# Patient Record
Sex: Female | Born: 1985 | Race: White | Hispanic: No | Marital: Married | Smoking: Never smoker
Health system: Southern US, Community
[De-identification: ages and names within clinical notes are randomized; demographics above are authoritative.]

## PROBLEM LIST (undated history)

## (undated) ENCOUNTER — Inpatient Hospital Stay (HOSPITAL_COMMUNITY): Payer: Self-pay

## (undated) DIAGNOSIS — R51 Headache: Secondary | ICD-10-CM

## (undated) DIAGNOSIS — R87629 Unspecified abnormal cytological findings in specimens from vagina: Secondary | ICD-10-CM

## (undated) DIAGNOSIS — F1910 Other psychoactive substance abuse, uncomplicated: Secondary | ICD-10-CM

## (undated) DIAGNOSIS — F32A Depression, unspecified: Secondary | ICD-10-CM

## (undated) DIAGNOSIS — F410 Panic disorder [episodic paroxysmal anxiety] without agoraphobia: Secondary | ICD-10-CM

## (undated) DIAGNOSIS — E039 Hypothyroidism, unspecified: Secondary | ICD-10-CM

## (undated) DIAGNOSIS — K219 Gastro-esophageal reflux disease without esophagitis: Secondary | ICD-10-CM

## (undated) DIAGNOSIS — R519 Headache, unspecified: Secondary | ICD-10-CM

## (undated) DIAGNOSIS — IMO0002 Reserved for concepts with insufficient information to code with codable children: Secondary | ICD-10-CM

## (undated) DIAGNOSIS — R87619 Unspecified abnormal cytological findings in specimens from cervix uteri: Secondary | ICD-10-CM

## (undated) DIAGNOSIS — F329 Major depressive disorder, single episode, unspecified: Secondary | ICD-10-CM

## (undated) DIAGNOSIS — E079 Disorder of thyroid, unspecified: Secondary | ICD-10-CM

## (undated) DIAGNOSIS — F909 Attention-deficit hyperactivity disorder, unspecified type: Secondary | ICD-10-CM

## (undated) HISTORY — PX: RHINOPLASTY: SUR1284

## (undated) HISTORY — DX: Major depressive disorder, single episode, unspecified: F32.9

## (undated) HISTORY — DX: Reserved for concepts with insufficient information to code with codable children: IMO0002

## (undated) HISTORY — PX: FRACTURE SURGERY: SHX138

## (undated) HISTORY — PX: COSMETIC SURGERY: SHX468

## (undated) HISTORY — PX: OTHER SURGICAL HISTORY: SHX169

## (undated) HISTORY — DX: Disorder of thyroid, unspecified: E07.9

## (undated) HISTORY — PX: BREAST SURGERY: SHX581

## (undated) HISTORY — DX: Unspecified abnormal cytological findings in specimens from cervix uteri: R87.619

## (undated) HISTORY — DX: Gastro-esophageal reflux disease without esophagitis: K21.9

## (undated) HISTORY — DX: Depression, unspecified: F32.A

## (undated) HISTORY — DX: Other psychoactive substance abuse, uncomplicated: F19.10

## (undated) HISTORY — DX: Attention-deficit hyperactivity disorder, unspecified type: F90.9

## (undated) HISTORY — PX: NASAL SEPTUM SURGERY: SHX37

---

## 2006-07-24 ENCOUNTER — Other Ambulatory Visit: Admission: RE | Admit: 2006-07-24 | Discharge: 2006-07-24 | Payer: Self-pay | Admitting: Obstetrics and Gynecology

## 2006-09-04 ENCOUNTER — Emergency Department (HOSPITAL_COMMUNITY): Admission: EM | Admit: 2006-09-04 | Discharge: 2006-09-05 | Payer: Self-pay | Admitting: Emergency Medicine

## 2008-03-11 ENCOUNTER — Inpatient Hospital Stay (HOSPITAL_COMMUNITY): Admission: EM | Admit: 2008-03-11 | Discharge: 2008-03-13 | Payer: Self-pay | Admitting: Emergency Medicine

## 2008-03-15 ENCOUNTER — Inpatient Hospital Stay (HOSPITAL_COMMUNITY): Admission: AD | Admit: 2008-03-15 | Discharge: 2008-03-16 | Payer: Self-pay | Admitting: Orthopedic Surgery

## 2008-03-22 ENCOUNTER — Ambulatory Visit (HOSPITAL_BASED_OUTPATIENT_CLINIC_OR_DEPARTMENT_OTHER): Admission: RE | Admit: 2008-03-22 | Discharge: 2008-03-22 | Payer: Self-pay | Admitting: Otolaryngology

## 2008-06-23 ENCOUNTER — Encounter (INDEPENDENT_AMBULATORY_CARE_PROVIDER_SITE_OTHER): Payer: Self-pay | Admitting: Family Medicine

## 2008-06-26 ENCOUNTER — Ambulatory Visit: Payer: Self-pay | Admitting: Nurse Practitioner

## 2008-06-26 ENCOUNTER — Encounter (INDEPENDENT_AMBULATORY_CARE_PROVIDER_SITE_OTHER): Payer: Self-pay | Admitting: Family Medicine

## 2008-06-26 DIAGNOSIS — K029 Dental caries, unspecified: Secondary | ICD-10-CM | POA: Insufficient documentation

## 2008-06-26 DIAGNOSIS — J309 Allergic rhinitis, unspecified: Secondary | ICD-10-CM | POA: Insufficient documentation

## 2010-06-21 ENCOUNTER — Ambulatory Visit: Payer: Self-pay | Admitting: Diagnostic Radiology

## 2010-06-21 ENCOUNTER — Emergency Department (HOSPITAL_BASED_OUTPATIENT_CLINIC_OR_DEPARTMENT_OTHER)
Admission: EM | Admit: 2010-06-21 | Discharge: 2010-06-21 | Payer: Self-pay | Source: Home / Self Care | Admitting: Emergency Medicine
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
## 2011-03-25 NOTE — Consult Note (Signed)
NAMESHARISSA, [name]                  ACCOUNT NO.:  [id number]   MEDICAL RECORD NO.:  [id number]          PATIENT TYPE:  OIB   LOCATION:  5040                         FACILITY:  [facility]   PHYSICIAN:  Kirsten Done, MD  DATE OF BIRTH:  [birth year]   DATE OF CONSULTATION:  DATE OF DISCHARGE:                                 CONSULTATION   Kirsten Fox was seen and evaluated on Mar 13, 2008.  The patient was consulted  by one of my partners Dr. Malon Kindle, for the evaluation and  treatment of her right comminuted ulnar shaft fracture.  I had a  discussion with the family on Mar 13, 2008 as well as the patient is felt  that the patient given her fracture and the comminution was recommended  that she undergo open reduction and internal fixation of the ulna shaft.  I talked with the patient and the patient's mother on Mar 13, 2008.  The  patient was discharged by the trauma service and brought back to the  hospital on Mar 14, 2008, to undergo the procedure.  At the time of the  initial presentation as well as the night of surgery, I talked about the  reasons for surgery given the displacement of the ulna shaft fracture  and the risks of surgery.  We talked about the risks include but not  limited to bleeding, infection, nerve tendon artery damage, nonunion,  malunion, hardware failure, loss of motion of the wrist and forearm need  for further surgical intervention as well as possible removal of the  implant.  All questions were addressed to the patient and the patient's  mother.  The plan was to do this as an outpatient; however, her pain was  not controlled.  She be admitted overnight for observation.  Signed  informed consent was obtained on the day of surgery.  The patient also  sustained other injuries but I was consulted and I was treating the ulna  shaft fracture.      Kirsten Done, MD  Electronically Signed     FWO/MEDQ  D:  03/14/2008  T:  03/15/2008  Job:  [id number]
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
## 2011-03-25 NOTE — Op Note (Signed)
Kirsten Fox, Kirsten Fox                  ACCOUNT NO.:  [id number]   MEDICAL RECORD NO.:  [id number]          PATIENT TYPE:  AMB   LOCATION:  DSC                          FACILITY:  [facility]   PHYSICIAN:  Suzanna Obey, M.D.       DATE OF BIRTH:  Apr 30, 1986   DATE OF PROCEDURE:  03/22/2008  DATE OF DISCHARGE:                               OPERATIVE REPORT   PREOPERATIVE DIAGNOSIS:  Nasal fracture.   POSTOPERATIVE DIAGNOSIS:  Nasal fracture.   PROCEDURE:  Closed reduction nasal fracture.   ANESTHESIA:  General.   ESTIMATED BLOOD LOSS:  Less than 5 milliliters.   INDICATIONS:  A 25 year old who has had problems with nasal fracture  after correction.  Her septa or her nose is deviated to right.  She is  informed the risk and benefits of the procedure, and options were  discussed.  All questions were answered, and consent was obtained.   OPERATION:  The patient was taken to the operating room and placed in  supine position.  After adequate LMA anesthesia, she was draped, and  then the oxymetazoline pledgets were placed into the nose bilaterally.  There was a small amount of crusting in the right side, but both sides  look nicely open and the septum was relatively midline.  There had been  an irritation up in the right upper nasal cavity that appeared to be  from a septal injury, but that has significantly improved.  The dorsum  was then positioned back into the midline using the butter knife and the  external pressure.  The nasal dorsum then looked to be in midline, and  Steri-Strips and benzoin were placed over the dorsum and an Aquaplast  cast placed.  The patient was awakened and brought to recovery room in  stable condition, counts correct.           ______________________________  Suzanna Obey, M.D.     JB/MEDQ  D:  03/22/2008  T:  03/23/2008  Job:  [id number]
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
## 2011-07-16 ENCOUNTER — Emergency Department (HOSPITAL_BASED_OUTPATIENT_CLINIC_OR_DEPARTMENT_OTHER)
Admission: EM | Admit: 2011-07-16 | Discharge: 2011-07-16 | Disposition: A | Discharge status: Home / Self Care | Payer: Self-pay | Attending: Emergency Medicine | Admitting: Emergency Medicine

## 2011-07-16 ENCOUNTER — Encounter: Payer: Self-pay | Admitting: *Deleted

## 2011-07-16 DIAGNOSIS — F1010 Alcohol abuse, uncomplicated: Secondary | ICD-10-CM | POA: Insufficient documentation

## 2011-07-16 DIAGNOSIS — F410 Panic disorder [episodic paroxysmal anxiety] without agoraphobia: Secondary | ICD-10-CM | POA: Insufficient documentation

## 2011-07-16 DIAGNOSIS — F10929 Alcohol use, unspecified with intoxication, unspecified: Secondary | ICD-10-CM

## 2011-07-16 HISTORY — DX: Panic disorder (episodic paroxysmal anxiety): F41.0

## 2011-07-16 LAB — URINALYSIS, ROUTINE W REFLEX MICROSCOPIC
Bilirubin Urine: NEGATIVE
Glucose, UA: NEGATIVE mg/dL
Hgb urine dipstick: NEGATIVE
Nitrite: NEGATIVE
Protein, ur: NEGATIVE mg/dL
Specific Gravity, Urine: 1.021 (ref 1.005–1.030)
Urobilinogen, UA: 0.2 mg/dL (ref 0.0–1.0)
pH: 5.5 (ref 5.0–8.0)

## 2011-07-16 LAB — CBC
Hemoglobin: 16.4 g/dL — ABNORMAL HIGH (ref 12.0–15.0)
MCH: 33 pg (ref 26.0–34.0)
Platelets: 280 10*3/uL (ref 150–400)
RBC: 4.97 MIL/uL (ref 3.87–5.11)
RDW: 12.7 % (ref 11.5–15.5)
WBC: 7 10*3/uL (ref 4.0–10.5)

## 2011-07-16 LAB — BASIC METABOLIC PANEL
BUN: 11 mg/dL (ref 6–23)
Chloride: 104 mEq/L (ref 96–112)
Glucose, Bld: 90 mg/dL (ref 70–99)
Potassium: 4 mEq/L (ref 3.5–5.1)

## 2011-07-16 LAB — DIFFERENTIAL
Basophils Absolute: 0 10*3/uL (ref 0.0–0.1)
Eosinophils Relative: 1 % (ref 0–5)
Lymphocytes Relative: 39 % (ref 12–46)
Monocytes Relative: 7 % (ref 3–12)
Neutro Abs: 3.7 10*3/uL (ref 1.7–7.7)
Neutrophils Relative %: 52 % (ref 43–77)

## 2011-07-16 LAB — PREGNANCY, URINE: Preg Test, Ur: NEGATIVE

## 2011-07-16 MED ORDER — SODIUM CHLORIDE 0.9 % IV BOLUS (SEPSIS)
1000.0000 mL | Freq: Once | INTRAVENOUS | Status: AC
  Administered 2011-07-16: 1000 mL via INTRAVENOUS

## 2011-07-16 NOTE — ED Notes (Signed)
Pt initally OOB to BR.  Pt unsteady gait and slurred speech noted.  Pt reports alcohol intake earlier.  Pt given specimen cup and instructions.  Pt urinated with no difficulty into cup then proceeded to discard specimen into sink and fill cup with water.  When asked what she was doing pt replied "giving a sample" and "this wont go on my record right"  Specimen/water was discarded into toilet and pt was informed that she will need to give another sample that will be supervised.  Pt escorted back to bed/room

## 2011-07-16 NOTE — ED Notes (Signed)
Pt. Has had etoh this night with noted xanax 1mg  this per Pt.
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
## 2011-07-16 NOTE — ED Notes (Signed)
Patient ambulates back to bed from restroom with friend/family member. Urien labeled and sent to lab for testing.

## 2011-07-16 NOTE — ED Provider Notes (Signed)
History     CSN: [id number] Arrival date & time: 07/16/2011 12:55 AM  Chief Complaint  Patient presents with  . Alcohol Intoxication   HPI Pt brought to the ED via EMS with reports of panic attack and alcohol intoxication. Pt's friends now at bedside state she got into an argument and then 'had a seizure'. Pt does not know what happened. Friend states she was trembling all over for less than a minute. Pt has no prior history of seizures. She has history of anxiety and panic attacks but stopped taking her xanax a few days ago. She took two remaining xanax earlier today before drinking EtOH.   Pt is unable to give clear history due to intoxication.   Past Medical History  Diagnosis Date  . Panic attacks     Past Surgical History  Procedure Date  . Fracture surgery     on the R arm with metal rod in forearm  . Cosmetic surgery   . Nasal septum surgery     No family history on file.  History  Substance Use Topics  . Smoking status: Not on file  . Smokeless tobacco: Not on file  . Alcohol Use:     OB History    Grav Para Term Preterm Abortions TAB SAB Ect Mult Living                  Review of Systems All other systems reviewed and are negative except as noted in HPI.   Physical Exam  BP 122/88  Pulse 111  Temp(Src) 97.5 F (36.4 C) (Oral)  Resp 15  SpO2 97%  LMP 07/09/2011  Physical Exam  Nursing note and vitals reviewed. Constitutional: She appears well-developed and well-nourished.  HENT:  Head: Normocephalic and atraumatic.  Eyes: EOM are normal. Pupils are equal, round, and reactive to light.  Neck: Normal range of motion. Neck supple.  Cardiovascular: Normal rate, normal heart sounds and intact distal pulses.   Pulmonary/Chest: Effort normal and breath sounds normal.  Abdominal: Bowel sounds are normal. She exhibits no distension. There is no tenderness.  Musculoskeletal: Normal range of motion. She exhibits no edema and no tenderness.  Neurological:  She is alert. No cranial nerve deficit.  Skin: Skin is warm and dry. No rash noted.  Psychiatric: She has a normal mood and affect.    ED Course  Procedures  MDM Labs and IVF ordered. Will observe for any further seizure activity.     4:14 AM Pt sleeping soundly. Friend at the bedside is comfortable taking her home for sobering. No seizure activity seen here.   Charles B. Bernette Mayers, MD 07/16/11 [phone]

## 2011-07-16 NOTE — ED Notes (Signed)
Pt. Reports she had beer and 1 liquor drink and her xanax.

## 2011-07-16 NOTE — ED Notes (Signed)
Pt. Jerking around in the bed with no noted seizure activity.

## 2011-07-16 NOTE — ED Notes (Signed)
Friends at bedside.

## 2011-07-16 NOTE — ED Notes (Signed)
Patient up to bathroom with assistance from me and her friend/family member. Patient with unsteady gait and slurred speech. Patient and friend/family member given instructions on prviding specimen.

## 2011-07-16 NOTE — ED Notes (Signed)
Pt. Reports she is upset due to being kicked out of his house.  Pt. Crying and gagging at same time.  Pt. Keeps asking about any friends here for support.

## 2011-07-18 ENCOUNTER — Encounter (HOSPITAL_BASED_OUTPATIENT_CLINIC_OR_DEPARTMENT_OTHER): Payer: Self-pay | Admitting: *Deleted

## 2011-12-16 ENCOUNTER — Telehealth: Payer: Self-pay

## 2011-12-16 NOTE — Telephone Encounter (Signed)
.  [facility]     PT STATES SWITCHING  30 TO 40 MG VYVANSE IS NOT WORKING,SHE IS CRASHING,NOT FOCUSING,GENERALLY DOES'NT FEEL  WELL.    PLEASE CALL [phone]

## 2011-12-16 NOTE — Telephone Encounter (Signed)
Please locate chart, if pt may need to RTC, have PA review

## 2011-12-17 NOTE — Telephone Encounter (Signed)
Spoke with patient and reviewed chart.  Due for OV this month, advised pt to RTC to discuss med/change rx.  She currently cannot afford to, but plans to recheck as soon as she is able.  Patient is unsure if Vyvanse is causing mood problems, or if her training schedule is.  Plans to continue med at this time, and recheck before out.  Reviewed with Nancie Neas, PAC.

## 2012-01-12 ENCOUNTER — Ambulatory Visit (INDEPENDENT_AMBULATORY_CARE_PROVIDER_SITE_OTHER): Payer: Self-pay | Admitting: Family Medicine

## 2012-01-12 DIAGNOSIS — F418 Other specified anxiety disorders: Secondary | ICD-10-CM

## 2012-01-12 DIAGNOSIS — F988 Other specified behavioral and emotional disorders with onset usually occurring in childhood and adolescence: Secondary | ICD-10-CM

## 2012-01-12 DIAGNOSIS — R5383 Other fatigue: Secondary | ICD-10-CM

## 2012-01-12 DIAGNOSIS — F341 Dysthymic disorder: Secondary | ICD-10-CM

## 2012-01-12 DIAGNOSIS — R635 Abnormal weight gain: Secondary | ICD-10-CM

## 2012-01-12 DIAGNOSIS — R5381 Other malaise: Secondary | ICD-10-CM

## 2012-01-12 DIAGNOSIS — R002 Palpitations: Secondary | ICD-10-CM

## 2012-01-12 LAB — POCT CBC
Granulocyte percent: 48.4 % (ref 37–80)
HCT, POC: 41.3 % (ref 37.7–47.9)
Hemoglobin: 13.3 g/dL (ref 12.2–16.2)
Lymph, poc: 3.4 (ref 0.6–3.4)
MCH, POC: 31.4 pg — AB (ref 27–31.2)
MCHC: 32.2 g/dL (ref 31.8–35.4)
MCV: 97.3 fL — AB (ref 80–97)
MID (cbc): 0.4 (ref 0–0.9)
MPV: 9.4 fL (ref 0–99.8)
POC Granulocyte: 3.5 (ref 2–6.9)
POC LYMPH PERCENT: 45.9 % (ref 10–50)
POC MID %: 5.7 % (ref 0–12)
Platelet Count, POC: 252 K/uL (ref 142–424)
RBC: 2.24 M/uL — AB (ref 4.04–5.48)
RDW, POC: 13.1 %
WBC: 7.3 K/uL (ref 4.6–10.2)

## 2012-01-12 MED ORDER — CLONAZEPAM 0.5 MG PO TABS: 0.5000 mg | ORAL_TABLET | Freq: Two times a day (BID) | ORAL | Status: DC | PRN

## 2012-01-12 NOTE — Progress Notes (Signed)
Subjective:    Patient ID: Kirsten Fox, female    DOB: 1986-01-22, 26 y.o.   MRN: [id number]  HPI Kirsten Fox is a 26 y.o. female Hx of PTSD, ADD.  Last ov 10/09/11.  proir rx adderall on 01/11/11, but had persistence of trouble focusing on reading/ paying attention/studying.  Tried mother's Vyvanse - seemed to work better.  Rx Vyvanse 30mg .  10/22/11 - increased dose over phone as 30mg  was not helping beyond first day.  Prescription picked up 12/02/11 (qualified for [employer] with no cost for rx through 11/25/12.  Hx of anxiety/insomnia per chart in past.  Multiple concerns tonight - feels like "crashed" from dieting past 3 weeks. Dieting for months prior, lost 10 pounds, but regained 10 pounds in past 2-3weeks.  Generally  Doesn't feel well.  Nutritionist told may be thyroid and took otc T3 supplement for 2 weeks.Kirsten Fox shooting pains in lower stomach - past few months - getting worse.  Hx of abnormal pap smears for past 5 years, and planning on colposcopy in 2 days.  [facility] 1 [facility], periods were every 3 weeks prior, but [facility].  Stomach swollen all the time, off and on for years, told may have IBS in past.   [facility] - negative chlamydia and gonorrhea testing.   Just doesn't feel good. Took growth hormone for 2 months, stopped 2 weeks ago.  Took Nolvadex at sime time (estrogen inhibitor).  No recent steroid use, last used steroids 2011. Feels very anxious.  Ran out of Vyvanse few weeks ago. Tolerating ok before, but made her "ill"/mean.  Has been feeling depressed/anxious past few weeks,  Crying spells.  No suicidal ideation.  Etoh: 3-4 drinks on weekend night off -  1 night per week. No illicit drug use. Nonsmoker.  Work 6d/week, school 5nts/week., Driving on revoked license recently.  Court date April 19th.    Review of Systems  Constitutional:  Positive for fatigue and unexpected weight change. Negative for fever and chills.  Respiratory: Positive for shortness of breath. Negative for cough.   Cardiovascular: Positive for chest pain and palpitations.       Palpitations last night.  Genitourinary: Negative for dysuria and difficulty urinating.  Psychiatric/Behavioral: Negative for suicidal ideas. The patient is nervous/anxious.        Anxious today.  Feeling depressed/down.  Multiple antidepressants before, but only liked wellbutrin - 300mg , last taken last year.   As above.  Voice deeper for past 1 year.     Objective:   Physical Exam  Constitutional: She is oriented to person, place, and time. She appears well-developed and well-nourished.  HENT:  Head: Normocephalic and atraumatic.  Right Ear: External ear normal.  Left Ear: External ear normal.  Eyes: Conjunctivae and EOM are normal. Pupils are equal, round, and reactive to light.  Neck: Normal range of motion.  Cardiovascular: Normal rate, regular rhythm, normal heart sounds and intact distal pulses.  Exam reveals no friction rub.   No murmur heard. Pulmonary/Chest: Effort normal and breath sounds normal. No respiratory distress.  Abdominal: Soft. Bowel sounds are normal. She exhibits no distension.  Neurological: She is alert and oriented to person, place, and time. She has normal strength. No sensory deficit.  Skin: Skin is warm and dry. No rash noted. No erythema.  Psychiatric: Her speech is normal and behavior is normal. Her mood appears anxious.  She expresses no suicidal ideation. She expresses no suicidal plans.   Results for orders placed in visit on 01/12/12  POCT CBC      Component Value Range   WBC 7.3  4.6 - 10.2 (K/uL)   Lymph, poc 3.4  0.6 - 3.4    POC LYMPH PERCENT 45.9  10 - 50 (%L)   MID (cbc) 0.4  0 - 0.9    POC MID % 5.7  0 - 12 (%M)   POC Granulocyte 3.5  2 - 6.9    Granulocyte percent 48.4  37 - 80 (%G)   RBC 2.24 (*) 4.04 - 5.48 (M/uL)    Hemoglobin 13.3  12.2 - 16.2 (g/dL)   HCT, POC 16.1  09.6 - 47.9 (%)   MCV 97.3 (*) 80 - 97 (fL)   MCH, POC 31.4 (*) 27 - 31.2 (pg)   MCHC 32.2  31.8 - 35.4 (g/dL)   RDW, POC 04.5     Platelet Count, POC 252  142 - 424 (K/uL)   MPV 9.4  0 - 99.8 (fL)          Assessment & Plan:  Kirsten Fox is a 26 y.o. female 1. Malaise  Basic metabolic panel  2. Weight gain, abnormal  TSH  3. Heart palpitations  POCT CBC  4. Depression with anxiety  TSH  5. ADD (attention deficit disorder)     Complicated history with underlying ADD, anxiety/depression, and use of growth hormone/estrogen blocking agent for weight loss and fitness competition.  Prior use of steroids.    Change in voice over past year.  Weight gain recently (regained wt lost for competition).  Palpitations and chest symptoms described as typical during anxiety - discussed EKG, but patient declined.  Given above symptoms, and affect change on Vyvanse, would not restart this med now, and no new supplements or otc hormones.  Check TSH, BMP.  Consider restart wellbutrin once labs return.  Will rx.short course of klonopin 0.5mg  BID prn next 2 weeks. # 20  Refer to endocrinology to determine next step in workup if needed, given growth hormone use.  [facility], and RTC or ER if abdominal pain persists.  Return to the clinic or go to the nearest emergency room if  symptoms worsen or new symptoms occur.  At end of visit- pt stated that she is also having infections in piercings, going on awhile. Drains pus at times.  Encouraged to rtc if sx's recur or if any drainage - rtc for eval/culture and possible antibiotics.

## 2012-01-13 LAB — BASIC METABOLIC PANEL
Calcium: 9.5 mg/dL (ref 8.4–10.5)
Potassium: 4.4 mEq/L (ref 3.5–5.3)

## 2012-01-16 ENCOUNTER — Telehealth: Payer: Self-pay | Admitting: Family Medicine

## 2012-01-16 NOTE — Telephone Encounter (Signed)
Patient requesting lab results. She stated she got a call from endocrinologist with appt but its going to cost 500.00 just to make that appt and wants to know what results showed. Please advise

## 2012-01-18 NOTE — Telephone Encounter (Signed)
Lab result call/instructions. already ordered.

## 2012-01-19 ENCOUNTER — Telehealth: Payer: Self-pay

## 2012-01-19 NOTE — Telephone Encounter (Signed)
Advised pt of lab results.

## 2012-01-19 NOTE — Telephone Encounter (Signed)
.  [facility]    PT STATES SHE CALLED LAST WK TO GET NAMES OF LABS DONE FOR HER ENDROCRONOLOGIST, DID NOT RECEIVE CALL BACK     BEST PHONE [phone]

## 2012-02-25 ENCOUNTER — Ambulatory Visit (INDEPENDENT_AMBULATORY_CARE_PROVIDER_SITE_OTHER): Payer: Self-pay | Admitting: Family Medicine

## 2012-02-25 ENCOUNTER — Encounter: Payer: Self-pay | Admitting: Physician Assistant

## 2012-02-25 ENCOUNTER — Encounter: Payer: Self-pay | Admitting: Family Medicine

## 2012-02-25 VITALS — BP 118/70 | HR 75 | Temp 99.0°F | Ht 64.0 in | Wt 150.6 lb

## 2012-02-25 DIAGNOSIS — N870 Mild cervical dysplasia: Secondary | ICD-10-CM

## 2012-02-25 LAB — POCT PREGNANCY, URINE: Preg Test, Ur: NEGATIVE

## 2012-02-25 NOTE — Patient Instructions (Signed)
No intercourse for 3-4 weeks Expect greyish vaginal discharge over the next week Please call with any concerns or complications. You will need a PAP in 6 and 12 months.

## 2012-02-25 NOTE — Progress Notes (Signed)
  Cryotherapy details The indications for cryotherapy were reviewed with the patient in detail. She was counseled about that efficacy of this procedure, and possible need for excisional procedure in the future if her cervical dysplasia persists.  The risks of the procedure where explained in detail and patient was told to expect a copious amount of discharge in the next few weeks. All her questions were answered, and written informed consent was obtained.  The patient was placed in the dorsal lithotomy position and a vaginal speculum was placed. Her cervix was visualized and patient was noted to have had normal size transformation zone. The appropriate cryotherapy probe was picked and affixed to cryotherapy apparatus. Then nitrogen gas was then activated, the probe was coated with lubricating jelly and applied to the transformation zone of the cervix. This was kept in place for 3 minutes. The cryotherapy was then stopped and all instruments were removed from the patient's pelvis; a thawing period of 3 minutes was observed.  A second cycle of cryotherapy was then administered to the cervix for 3 minutes.  The patient tolerated the procedure well without any complications. Routine post procedure instructions were given to the patient.  Patient is to return to the clinic in 4 weeks for follow-up.  Follow up PAP in 6 and 12 months.

## 2012-03-01 ENCOUNTER — Telehealth: Payer: Self-pay | Admitting: *Deleted

## 2012-03-01 NOTE — Telephone Encounter (Signed)
Patient called stating having complications from procedure that was done in office on 4/18. Pt would also like to know if she can take z-pak.

## 2012-03-03 NOTE — Telephone Encounter (Signed)
Called pt and spoke with and she informed me that she has been having a lot of discharge that is clear with yellow tinged with no odor.  Per Dr. Adrian Blackwater- pt can have discharge up to 2 weeks.  I advised pt to please make sure that she comes to her appt for follow up on 03/29/12 and to make sure that she gets her financial assistance paperwork sent in as soon as possible.  Pt stated understanding and had no further questions.

## 2012-03-29 ENCOUNTER — Ambulatory Visit: Payer: Self-pay | Admitting: Family Medicine

## 2012-04-28 ENCOUNTER — Ambulatory Visit: Payer: Self-pay | Admitting: Family Medicine

## 2012-05-04 ENCOUNTER — Ambulatory Visit (INDEPENDENT_AMBULATORY_CARE_PROVIDER_SITE_OTHER): Payer: Self-pay | Admitting: Family Medicine

## 2012-05-04 ENCOUNTER — Encounter: Payer: Self-pay | Admitting: Family Medicine

## 2012-05-04 VITALS — BP 110/69 | HR 78 | Ht 64.0 in | Wt 154.2 lb

## 2012-05-04 DIAGNOSIS — N870 Mild cervical dysplasia: Secondary | ICD-10-CM

## 2012-05-04 NOTE — Patient Instructions (Signed)
Please have PAP done at 6 months and 12 months after the cryotherapy procedure was done.

## 2012-05-04 NOTE — Progress Notes (Signed)
  Subjective:    Patient ID: Kirsten Fox, female    DOB: 1986-09-01, 26 y.o.   MRN: [id number]  HPI Patient seen for follow up of cryotherapy preformed on 02/25/12.  Has no complaints today.  Had grayish discharge following procedure for a couple of weeks, but this has improved significantly.  Denies bleeding, cramping, discharge, pain.   Review of Systems     Objective:   Physical Exam  Constitutional: She is oriented to person, place, and time. She appears well-developed and well-nourished.  Pulmonary/Chest: Effort normal.  Abdominal: Soft. She exhibits no distension and no mass. There is no tenderness. There is no rebound and no guarding.  Neurological: She is alert and oriented to person, place, and time.  Skin: Skin is warm and dry.  Psychiatric: She has a normal mood and affect. Her behavior is normal. Judgment and thought content normal.      Assessment & Plan:  1.  Persistent CIN1 - s/p cryotherapy.  No significant problems.  [facility] 6 and 12 months following procedure and for birth control.

## 2012-11-12 ENCOUNTER — Ambulatory Visit: Payer: Self-pay | Admitting: Family Medicine

## 2012-11-12 VITALS — BP 119/76 | HR 75 | Temp 98.5°F | Resp 16 | Ht 65.0 in | Wt 159.0 lb

## 2012-11-12 DIAGNOSIS — F418 Other specified anxiety disorders: Secondary | ICD-10-CM

## 2012-11-12 DIAGNOSIS — F909 Attention-deficit hyperactivity disorder, unspecified type: Secondary | ICD-10-CM

## 2012-11-12 DIAGNOSIS — F341 Dysthymic disorder: Secondary | ICD-10-CM

## 2012-11-12 MED ORDER — AMPHETAMINE-DEXTROAMPHETAMINE 5 MG PO TABS: 5.0000 mg | ORAL_TABLET | Freq: Every day | ORAL | Status: DC

## 2012-11-12 MED ORDER — AMPHETAMINE-DEXTROAMPHETAMINE 15 MG PO TABS: 15.0000 mg | ORAL_TABLET | Freq: Every day | ORAL | Status: DC

## 2012-11-12 MED ORDER — CLONAZEPAM 0.5 MG PO TABS: 0.5000 mg | ORAL_TABLET | Freq: Two times a day (BID) | ORAL | Status: DC | PRN

## 2012-11-12 MED ORDER — BUPROPION HCL ER (SR) 150 MG PO TB12: 150.0000 mg | ORAL_TABLET | Freq: Two times a day (BID) | ORAL | Status: DC

## 2012-11-12 NOTE — Progress Notes (Signed)
[facility]:  Office Visit  Chief Complaint:  Chief Complaint  Patient presents with  . Anxiety    and Depression    HPI: Kirsten Fox is a 27 y.o. female who complains of  Anxiety and Depression, was previously dx with this years ago. Had been on SSRIs and also  Wellbutrin. Other SSRI drugs made her gain weight. She was off of meds because she thought she was better but now thinks she needs it since she is feeling more depressed due to being unemployed and other issues. She lost her drivers license due to her DUI 5 years ago. She is no longer using alcohol. However in order to get to her job she had to drive but was not supposed to and got ticketed and got her license revoked. Not sure if wellbutrin helped. She has also a h/o ADHD. She also has anxiety and was on Xanax and then switched to Klonopin. She is down, can't get out of bed, can't shower. 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 She lives in [place]. Last thing she was on she was Wellbutrin, Vyvanse, and Klonopin. Vyvanse made her have HAs. She denies being suicidal or homicidal. She denies mania. She is staying with her grandmother because she started having depressive sxs and [place] and needs some support. She wants to see a therapist.  Just lost her job, was a Production designer, theatre/television/film at [employer]. They sold out to a new company and she was let go and put on unemployment. 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      Past Medical History  Diagnosis Date  . Panic attacks   . Abnormal Pap smear     2 colposcopys  . Depression   . Ulcer   . ADHD (attention deficit hyperactivity disorder)    Past Surgical History  Procedure Date  . Fracture surgery     on the R arm with metal rod in forearm  . Cosmetic surgery   . Nasal septum surgery   . Breast surgery       Implants   History   Social History  . Marital Status: Single    Spouse Name: N/A    Number of Children: N/A  . Years of Education: N/A   Social History Main Topics  . Smoking status: Never Smoker   . Smokeless tobacco: Never Used  . Alcohol Use: No     Comment: once a week  . Drug Use: No  . Sexually Active: Yes    Birth Control/ Protection: IUD   Other Topics Concern  . None   Social History Narrative  . None   No family history on file. Allergies  Allergen Reactions  . Dilaudid (Hydromorphone Hcl)   . Hydrocodone-Acetaminophen   . Morphine And Related    Prior to Admission medications   Medication Sig Start Date End Date Taking? Authorizing Provider  levonorgestrel (MIRENA) 20 MCG/24HR IUD 1 each by Intrauterine route once.   Yes Historical Provider, MD     ROS: The patient denies fevers, chills, night sweats, unintentional weight loss, chest pain, palpitations, wheezing, dyspnea on exertion, nausea, vomiting, abdominal pain, dysuria, hematuria, melena, numbness, weakness, or tingling.   All other systems have been reviewed and were otherwise negative with the exception of those mentioned in  the HPI and as above.    PHYSICAL EXAM: Filed Vitals:   11/12/12 1736  BP: 119/76  Pulse: 75  Temp: 98.5 F (36.9 C)  Resp: 16   Filed Vitals:   11/12/12 1736  Height: 5\' 5"  (1.651 m)  Weight: 159 lb (72.122 kg)   Body mass index is 26.46 kg/(m^2).  General: Alert, + depressed, slightly tearful, sound hopeless HEENT:  Normocephalic, atraumatic, oropharynx patent.  Cardiovascular:  Regular rate and rhythm, no rubs murmurs or gallops.  No Carotid bruits, radial pulse intact. No pedal edema.  Respiratory: Clear to auscultation bilaterally.  No wheezes, rales, or rhonchi.  No cyanosis, no use of accessory musculature GI: No organomegaly, abdomen is soft and non-tender, positive bowel sounds.  No masses. Skin: No rashes. Neurologic: Facial musculature  symmetric. Psychiatric: Patient is appropriate throughout our interaction. Lymphatic: No cervical lymphadenopathy Musculoskeletal: Gait intact.   LABS: Results for orders placed in visit on 02/25/12  POCT PREGNANCY, URINE      Component Value Range   Preg Test, Ur NEGATIVE  NEGATIVE     EKG/XRAY:   Primary read interpreted by Dr. Conley Rolls at [facility].   ASSESSMENT/PLAN: Encounter Diagnoses  Name Primary?  . Depression with anxiety Yes  . ADHD (attention deficit hyperactivity disorder)    Narcotic profile pulled, she is clean. Old chart pulled, she is consistent with what she is telling me and what was documented in chart.  Zung Anxiety 65 on anxiety index ( marked to severe Anxiety) Zung Depression 58 which is indicative of Depression as well Rx Adderall 15 mg in Am, 5 mg in PM #30 no RF Rx Klonopin 0.5 mg BID prn anxiety  #30, 1 RF Rx Wellbutrin 150 mg BID  F/u in 1 month Referral to [facility] Go to ER prn   Josh Nicolosi PHUONG, DO 11/13/2012 1:48 PM

## 2012-11-13 ENCOUNTER — Encounter: Payer: Self-pay | Admitting: Family Medicine

## 2013-03-31 ENCOUNTER — Encounter: Payer: Self-pay | Admitting: Obstetrics & Gynecology

## 2013-03-31 ENCOUNTER — Ambulatory Visit (INDEPENDENT_AMBULATORY_CARE_PROVIDER_SITE_OTHER): Payer: Self-pay | Admitting: Obstetrics & Gynecology

## 2013-03-31 VITALS — BP 122/70 | HR 67 | Temp 97.1°F | Ht 64.0 in | Wt 152.6 lb

## 2013-03-31 DIAGNOSIS — Z309 Encounter for contraceptive management, unspecified: Secondary | ICD-10-CM

## 2013-03-31 DIAGNOSIS — Z30432 Encounter for removal of intrauterine contraceptive device: Secondary | ICD-10-CM

## 2013-03-31 NOTE — Progress Notes (Signed)
Patient ID: Kirsten Fox, female   DOB: October 20, 1986, 27 y.o.   MRN: [id number] The patient was seen by her primary care provider who attempted to remove her IUD without success.  She was referred here for evaluation.    The patient was placed in the dorsal lithotomy position, normal external genitalia was noted.  A speculum was placed in the patient's vagina, normal discharge was noted, no lesions. The cervix was visualized, no lesions, no abnormal discharge;  and the cervix was swabbed with Betadine using scopettes. The strings of the IUD could not be visualized.  A single toothed tenaculum was placed on the anterior lip of the cervix.  An attempt was made to remove it with kelly clamps with no success.  An IUD extractor was then used to successfully remove the IUD in its entirety.  Patient tolerated the procedure well.

## 2013-03-31 NOTE — Patient Instructions (Addendum)
Contraception Choices  Contraception (birth control) is the use of any methods or devices to prevent pregnancy. Below are some methods to help avoid pregnancy.  HORMONAL METHODS   · Contraceptive implant. This is a thin, plastic tube containing progesterone hormone. It does not contain estrogen hormone. Your caregiver inserts the tube in the inner part of the upper arm. The tube can remain in place for up to 3 years. After 3 years, the implant must be removed. The implant prevents the ovaries from releasing an egg (ovulation), thickens the cervical mucus which prevents sperm from entering the uterus, and thins the lining of the inside of the uterus.  · Progesterone-only injections. These injections are given every 3 months by your caregiver to prevent pregnancy. This synthetic progesterone hormone stops the ovaries from releasing eggs. It also thickens cervical mucus and changes the uterine lining. This makes it harder for sperm to survive in the uterus.  · Birth control pills. These pills contain estrogen and progesterone hormone. They work by stopping the egg from forming in the ovary (ovulation). Birth control pills are prescribed by a caregiver. Birth control pills can also be used to treat heavy periods.  · Minipill. This type of birth control pill contains only the progesterone hormone. They are taken every day of each month and must be prescribed by your caregiver.  · Birth control patch. The patch contains hormones similar to those in birth control pills. It must be changed once a week and is prescribed by a caregiver.  · Vaginal ring. The ring contains hormones similar to those in birth control pills. It is left in the vagina for 3 weeks, removed for 1 week, and then a new one is put back in place. The patient must be comfortable inserting and removing the ring from the vagina. A caregiver's prescription is necessary.  · Emergency contraception. Emergency contraceptives prevent pregnancy after unprotected  sexual intercourse. This pill can be taken right after sex or up to 5 days after unprotected sex. It is most effective the sooner you take the pills after having sexual intercourse. Emergency contraceptive pills are available without a prescription. Check with your pharmacist. Do not use emergency contraception as your only form of birth control.  BARRIER METHODS   · Female condom. This is a thin sheath (latex or rubber) that is worn over the penis during sexual intercourse. It can be used with spermicide to increase effectiveness.  · Female condom. This is a soft, loose-fitting sheath that is put into the vagina before sexual intercourse.  · Diaphragm. This is a soft, latex, dome-shaped barrier that must be fitted by a caregiver. It is inserted into the vagina, along with a spermicidal jelly. It is inserted before intercourse. The diaphragm should be left in the vagina for 6 to 8 hours after intercourse.  · Cervical cap. This is a round, soft, latex or plastic cup that fits over the cervix and must be fitted by a caregiver. The cap can be left in place for up to 48 hours after intercourse.  · Sponge. This is a soft, circular piece of polyurethane foam. The sponge has spermicide in it. It is inserted into the vagina after wetting it and before sexual intercourse.  · Spermicides. These are chemicals that kill or block sperm from entering the cervix and uterus. They come in the form of creams, jellies, suppositories, foam, or tablets. They do not require a prescription. They are inserted into the vagina with an applicator before having sexual intercourse.   The process must be repeated every time you have sexual intercourse.  INTRAUTERINE CONTRACEPTION  · Intrauterine device (IUD). This is a T-shaped device that is put in a woman's uterus during a menstrual period to prevent pregnancy. There are 2 types:  · Copper IUD. This type of IUD is wrapped in copper wire and is placed inside the uterus. Copper makes the uterus and  fallopian tubes produce a fluid that kills sperm. It can stay in place for 10 years.  · Hormone IUD. This type of IUD contains the hormone progestin (synthetic progesterone). The hormone thickens the cervical mucus and prevents sperm from entering the uterus, and it also thins the uterine lining to prevent implantation of a fertilized egg. The hormone can weaken or kill the sperm that get into the uterus. It can stay in place for 5 years.  PERMANENT METHODS OF CONTRACEPTION  · Female tubal ligation. This is when the woman's fallopian tubes are surgically sealed, tied, or blocked to prevent the egg from traveling to the uterus.  · Female sterilization. This is when the female has the tubes that carry sperm tied off (vasectomy). This blocks sperm from entering the vagina during sexual intercourse. After the procedure, the man can still ejaculate fluid (semen).  NATURAL PLANNING METHODS  · Natural family planning. This is not having sexual intercourse or using a barrier method (condom, diaphragm, cervical cap) on days the woman could become pregnant.  · Calendar method. This is keeping track of the length of each menstrual cycle and identifying when you are fertile.  · Ovulation method. This is avoiding sexual intercourse during ovulation.  · Symptothermal method. This is avoiding sexual intercourse during ovulation, using a thermometer and ovulation symptoms.  · Post-ovulation method. This is timing sexual intercourse after you have ovulated.  Regardless of which type or method of contraception you choose, it is important that you use condoms to protect against the transmission of sexually transmitted diseases (STDs). Talk with your caregiver about which form of contraception is most appropriate for you.  Document Released: 10/27/2005 Document Revised: 01/19/2012 Document Reviewed: 03/05/2011  [employer]® Patient Information ©2014 [employer].

## 2013-04-13 ENCOUNTER — Encounter: Payer: Self-pay | Admitting: *Deleted

## 2013-05-23 ENCOUNTER — Ambulatory Visit: Payer: BC Managed Care – PPO

## 2013-05-23 ENCOUNTER — Ambulatory Visit (INDEPENDENT_AMBULATORY_CARE_PROVIDER_SITE_OTHER): Payer: BC Managed Care – PPO | Admitting: Emergency Medicine

## 2013-05-23 VITALS — BP 120/80 | HR 64 | Temp 98.0°F | Resp 16 | Ht 64.0 in | Wt 155.0 lb

## 2013-05-23 DIAGNOSIS — T148XXA Other injury of unspecified body region, initial encounter: Secondary | ICD-10-CM

## 2013-05-23 DIAGNOSIS — M79642 Pain in left hand: Secondary | ICD-10-CM

## 2013-05-23 DIAGNOSIS — T07XXXA Unspecified multiple injuries, initial encounter: Secondary | ICD-10-CM

## 2013-05-23 DIAGNOSIS — M79609 Pain in unspecified limb: Secondary | ICD-10-CM

## 2013-05-23 DIAGNOSIS — S60229A Contusion of unspecified hand, initial encounter: Secondary | ICD-10-CM

## 2013-05-23 DIAGNOSIS — M79641 Pain in right hand: Secondary | ICD-10-CM

## 2013-05-23 DIAGNOSIS — S60221A Contusion of right hand, initial encounter: Secondary | ICD-10-CM

## 2013-05-23 DIAGNOSIS — S60222A Contusion of left hand, initial encounter: Secondary | ICD-10-CM

## 2013-05-23 MED ORDER — NAPROXEN SODIUM 550 MG PO TABS: 550.0000 mg | ORAL_TABLET | Freq: Two times a day (BID) | ORAL | Status: DC

## 2013-05-23 NOTE — Patient Instructions (Addendum)
Contusion A contusion is a deep bruise. Contusions are the result of an injury that caused bleeding under the skin. The contusion may turn blue, purple, or yellow. Minor injuries will give you a painless contusion, but more severe contusions may stay painful and swollen for a few weeks.  CAUSES  A contusion is usually caused by a blow, trauma, or direct force to an area of the body. SYMPTOMS   Swelling and redness of the injured area.  Bruising of the injured area.  Tenderness and soreness of the injured area.  Pain. DIAGNOSIS  The diagnosis can be made by taking a history and physical exam. An X-ray, CT scan, or MRI may be needed to determine if there were any associated injuries, such as fractures. TREATMENT  Specific treatment will depend on what area of the body was injured. In general, the best treatment for a contusion is resting, icing, elevating, and applying cold compresses to the injured area. Over-the-counter medicines may also be recommended for pain control. Ask your caregiver what the best treatment is for your contusion. HOME CARE INSTRUCTIONS   Put ice on the injured area.  Put ice in a plastic bag.  Place a towel between your skin and the bag.  Leave the ice on for 15-20 minutes, 3-4 times a day.  Only take over-the-counter or prescription medicines for pain, discomfort, or fever as directed by your caregiver. Your caregiver may recommend avoiding anti-inflammatory medicines (aspirin, ibuprofen, and naproxen) for 48 hours because these medicines may increase bruising.  Rest the injured area.  If possible, elevate the injured area to reduce swelling. SEEK IMMEDIATE MEDICAL CARE IF:   You have increased bruising or swelling.  You have pain that is getting worse.  Your swelling or pain is not relieved with medicines. MAKE SURE YOU:   Understand these instructions.  Will watch your condition.  Will get help right away if you are not doing well or get  worse. Document Released: 08/06/2005 Document Revised: 01/19/2012 Document Reviewed: 09/01/2011 Exit[employer] Patient Information 2014 [employer].  
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
## 2013-06-13 ENCOUNTER — Other Ambulatory Visit: Payer: Self-pay | Admitting: Family Medicine

## 2013-08-20 ENCOUNTER — Ambulatory Visit (INDEPENDENT_AMBULATORY_CARE_PROVIDER_SITE_OTHER): Payer: BC Managed Care – PPO

## 2013-08-22 ENCOUNTER — Ambulatory Visit: Payer: BC Managed Care – PPO

## 2013-08-22 ENCOUNTER — Ambulatory Visit (INDEPENDENT_AMBULATORY_CARE_PROVIDER_SITE_OTHER): Payer: BC Managed Care – PPO | Admitting: Emergency Medicine

## 2013-08-22 VITALS — BP 112/64 | HR 70 | Temp 98.2°F | Resp 14 | Ht 65.0 in | Wt 166.2 lb

## 2013-08-22 DIAGNOSIS — K219 Gastro-esophageal reflux disease without esophagitis: Secondary | ICD-10-CM

## 2013-08-22 DIAGNOSIS — R109 Unspecified abdominal pain: Secondary | ICD-10-CM

## 2013-08-22 DIAGNOSIS — K5900 Constipation, unspecified: Secondary | ICD-10-CM

## 2013-08-22 LAB — POCT UA - MICROSCOPIC ONLY
Bacteria, U Microscopic: NEGATIVE
Casts, Ur, LPF, POC: NEGATIVE
Mucus, UA: NEGATIVE
Yeast, UA: NEGATIVE

## 2013-08-22 LAB — POCT URINALYSIS DIPSTICK
Bilirubin, UA: NEGATIVE
Blood, UA: NEGATIVE
Glucose, UA: NEGATIVE
Nitrite, UA: NEGATIVE
Spec Grav, UA: 1.015
Urobilinogen, UA: 0.2

## 2013-08-22 LAB — POCT CBC
Granulocyte percent: 55.4 %G (ref 37–80)
Hemoglobin: 12.9 g/dL (ref 12.2–16.2)
MCH, POC: 32.2 pg — AB (ref 27–31.2)
MID (cbc): 0.4 (ref 0–0.9)
MPV: 10.4 fL (ref 0–99.8)
POC Granulocyte: 3.3 (ref 2–6.9)
POC MID %: 7.2 %M (ref 0–12)
Platelet Count, POC: 230 10*3/uL (ref 142–424)
RBC: 4.01 M/uL — AB (ref 4.04–5.48)

## 2013-08-22 LAB — POCT URINE PREGNANCY: Preg Test, Ur: NEGATIVE

## 2013-08-22 NOTE — Progress Notes (Signed)
[facility] [address], [place] [address] [phone]  Date:  08/22/2013   Name:  Kirsten Fox   DOB:  08/07/1986   MRN:  [id number]  PCP:  No PCP Per Patient    Chief Complaint: Abdominal Pain   History of Present Illness:  Kirsten Fox is a 27 y.o. very pleasant female patient who presents with the following:  Previously diagnosed with IBS, leaky bowel syndrome, GERD. Has undergone multiple endoscopies and has been under treatment with "holistic" doctors and nutritionists for six months with no resolution of her symptoms.  She has a one year history of increasing abdominal pain, bloating, and inability to eat.  She says she has recently gained 8 pounds of "water" and that has caused her abdomen to distend.  Says her abdomen is so tender that she cannot touch it or lay on it and is so nauseated she cannot eat anything.  Has no specific food intolerance.  No excess alcohol use, NSAID use.  Recently put on flagyl for BV and says her symptoms have somewhat improved.  Recently started on prilosec again with no improvement.  No stool change or fever or chills.  Was vomiting at night and after eating but that has improved.  No jaundice.  No improvement with over the counter medications or other home remedies. Denies other complaint or health concern today.   Patient Active Problem List   Diagnosis Date Noted  . 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 ALLERGIC RHINITIS 06/26/2008  . DENTAL CARIES 06/26/2008    Past Medical History  Diagnosis Date  . Panic attacks   . Abnormal Pap smear     2 colposcopys  . Depression   . Ulcer   . ADHD (attention deficit hyperactivity disorder)     Past Surgical History  Procedure Laterality Date  . Fracture surgery      on the R arm with metal rod in forearm  . Cosmetic surgery    . Nasal septum surgery    . Breast surgery      Implants  . Rhinoplasty      History  Substance Use Topics  . Smoking  status: Never Smoker   . Smokeless tobacco: Never Used  . Alcohol Use: No     Comment: once a week    History reviewed. No pertinent family history.  Allergies  Allergen Reactions  . Dilaudid [Hydromorphone Hcl]   . Hydrocodone-Acetaminophen   . Morphine And Related     Medication list has been reviewed and updated.  Current Outpatient Prescriptions on File Prior to Visit  Medication Sig Dispense Refill  . clonazePAM (KLONOPIN) 0.5 MG tablet TAKE 1 TABLET BY MOUTH TWICE DAILY AS NEEDED FOR ANXIETY  30 tablet  0  . Thyroid (NATURE-THROID PO) Take 1 tablet by mouth 2 (two) times daily.       No current facility-administered medications on file prior to visit.    Review of Systems:  As per HPI, otherwise negative.    Physical Examination: Filed Vitals:   08/22/13 1620  BP: 112/64  Pulse: 70  Temp: 98.2 F (36.8 C)  Resp: 14   Filed Vitals:   08/22/13 1620  Height: 5\' 5"  (1.651 m)  Weight: 166 lb 3.2 oz (75.388 kg)   Body mass index is 27.66 kg/(m^2). Ideal Body Weight: Weight in (lb) to have BMI = 25: 149.9  GEN: WDWN, NAD, Non-toxic, A & O x 3 HEENT: Atraumatic,  Normocephalic. Neck supple. No masses, No LAD. Ears and Nose: No external deformity. CV: RRR, No M/G/R. No JVD. No thrill. No extra heart sounds. PULM: CTA B, no wheezes, crackles, rhonchi. No retractions. No resp. distress. No accessory muscle use. ABD: S, generalized tenderness, ND, +BS. No rebound. No HSM. EXTR: No c/c/e NEURO Normal gait.  PSYCH: Normally interactive. Conversant. Not depressed or anxious appearing.  Calm demeanor.    Assessment and Plan: Abdominal pain.  Constipation miralax Await labs ?sono gb  Signed,  Phillips Odor, MD   Results for orders placed in visit on 08/22/13  POCT CBC      Result Value Range   WBC 6.0  4.6 - 10.2 K/uL   Lymph, poc 2.2  0.6 - 3.4   POC LYMPH PERCENT 37.4  10 - 50 %L   MID (cbc) 0.4  0 - 0.9   POC MID % 7.2  0 - 12 %M   POC  Granulocyte 3.3  2 - 6.9   Granulocyte percent 55.4  37 - 80 %G   RBC 4.01 (*) 4.04 - 5.48 M/uL   Hemoglobin 12.9  12.2 - 16.2 g/dL   HCT, POC 40.9  81.1 - 47.9 %   MCV 99.8 (*) 80 - 97 fL   MCH, POC 32.2 (*) 27 - 31.2 pg   MCHC 32.3  31.8 - 35.4 g/dL   RDW, POC 91.4     Platelet Count, POC 230  142 - 424 K/uL   MPV 10.4  0 - 99.8 fL  POCT UA - MICROSCOPIC ONLY      Result Value Range   WBC, Ur, HPF, POC 0-1     RBC, urine, microscopic neg     Bacteria, U Microscopic neg     Mucus, UA neg     Epithelial cells, urine per micros 0-1     Crystals, Ur, HPF, POC neg     Casts, Ur, LPF, POC neg     Yeast, UA neg    POCT URINALYSIS DIPSTICK      Result Value Range   Color, UA yellow     Clarity, UA clear     Glucose, UA neg     Bilirubin, UA neg     Ketones, UA neg     Spec Grav, UA 1.015     Blood, UA neg     pH, UA 7.0     Protein, UA neg     Urobilinogen, UA 0.2     Nitrite, UA neg     Leukocytes, UA Negative    POCT URINE PREGNANCY      Result Value Range   Preg Test, Ur Negative      [facility] reading (PRIMARY) by  Dr. Dareen Piano.  AAS:  Increased stool and air.  No obstruction or free ar.

## 2013-08-22 NOTE — Patient Instructions (Signed)

## 2013-08-23 LAB — COMPREHENSIVE METABOLIC PANEL
AST: 38 U/L — ABNORMAL HIGH (ref 0–37)
Albumin: 4.6 g/dL (ref 3.5–5.2)
Alkaline Phosphatase: 36 U/L — ABNORMAL LOW (ref 39–117)
BUN: 15 mg/dL (ref 6–23)
Calcium: 9.7 mg/dL (ref 8.4–10.5)
Chloride: 103 mEq/L (ref 96–112)
Potassium: 4.2 mEq/L (ref 3.5–5.3)
Sodium: 136 mEq/L (ref 135–145)
Total Protein: 7.1 g/dL (ref 6.0–8.3)

## 2013-08-23 LAB — LIPASE: Lipase: 19 U/L (ref 0–75)

## 2013-08-24 ENCOUNTER — Telehealth: Payer: Self-pay

## 2013-08-24 LAB — H. PYLORI ANTIBODY, IGG: H Pylori IgG: <0.4 {ISR}

## 2013-08-24 NOTE — Telephone Encounter (Signed)
Thanks I have called her to advise left message for message her to call me back.

## 2013-08-24 NOTE — Addendum Note (Signed)
Addended by: Carmelina Dane on: 08/24/2013 01:52 PM   Modules accepted: Orders

## 2013-08-24 NOTE — Telephone Encounter (Signed)
Suggest she try a couple fleets enemas.  She should continue the miralax.  Labs so far are negative

## 2013-08-24 NOTE — Telephone Encounter (Signed)
Wants labs  And wants to talk about ov   [phone]

## 2013-08-24 NOTE — Telephone Encounter (Signed)
H pylori still pending. Called her, to advise. Patient states she took Miralax and now she is not having bowel movements. She indicates she also took magnesium citrate and has not had a bowel movement. Please advise.

## 2013-08-26 ENCOUNTER — Ambulatory Visit (INDEPENDENT_AMBULATORY_CARE_PROVIDER_SITE_OTHER): Payer: BC Managed Care – PPO | Admitting: Family Medicine

## 2013-08-26 VITALS — BP 122/74 | HR 76 | Temp 98.9°F | Resp 17 | Ht 62.5 in | Wt 163.0 lb

## 2013-08-26 DIAGNOSIS — K589 Irritable bowel syndrome without diarrhea: Secondary | ICD-10-CM

## 2013-08-26 DIAGNOSIS — R109 Unspecified abdominal pain: Secondary | ICD-10-CM

## 2013-08-26 DIAGNOSIS — R079 Chest pain, unspecified: Secondary | ICD-10-CM

## 2013-08-26 LAB — POCT CBC
Lymph, poc: 2 (ref 0.6–3.4)
MCH, POC: 32.5 pg — AB (ref 27–31.2)
MCHC: 32.9 g/dL (ref 31.8–35.4)
MCV: 99 fL — AB (ref 80–97)
MID (cbc): 0.4 (ref 0–0.9)
MPV: 9.6 fL (ref 0–99.8)
POC LYMPH PERCENT: 42.8 %L (ref 10–50)
POC MID %: 9.2 %M (ref 0–12)
Platelet Count, POC: 257 10*3/uL (ref 142–424)
RBC: 4.24 M/uL (ref 4.04–5.48)
WBC: 4.7 10*3/uL (ref 4.6–10.2)

## 2013-08-26 MED ORDER — DICYCLOMINE HCL 10 MG PO CAPS: 10.0000 mg | ORAL_CAPSULE | Freq: Three times a day (TID) | ORAL | Status: DC

## 2013-08-26 NOTE — Patient Instructions (Addendum)
Your labs look ok today.  We will get you in with GI again and will be on the look out for your ultrasound results.  If you are getting worse please let me know.    If you want to try the bentyl once your constipation is resolved it might be helpful for bloating and churning.

## 2013-08-26 NOTE — Telephone Encounter (Signed)
Patient is scheduled for the [place]. She indicates she still has not have a bowel movement. She did pass some mucus rectally, advised her to return to clinic.

## 2013-08-26 NOTE — Progress Notes (Signed)
[facility] [address] [place] [address] [phone]  Date:  08/26/2013   Name:  Kirsten Fox   DOB:  06/08/1986   MRN:  [id number]  PCP:  No PCP Per Patient    Chief Complaint: Constipation and Abdominal Pain   History of Present Illness:  Kirsten Fox is a 27 y.o. very pleasant female patient who presents with the following:  Here today to recheck abdominal pain.  She called today with concern about passing mucus in her stool and was instructed to come in for a recheck.   She was here on 10/13 and saw Dr. Dareen Piano.  HPI from that visit: Previously diagnosed with IBS, leaky bowel syndrome, GERD. Has undergone multiple endoscopies and has been under treatment with "holistic" doctors and nutritionists for six months with no resolution of her symptoms. She has a one year history of increasing abdominal pain, bloating, and inability to eat. She says she has recently gained 8 pounds of "water" and that has caused her abdomen to distend. Says her abdomen is so tender that she cannot touch it or lay on it and is so nauseated she cannot eat anything. Has no specific food intolerance. No excess alcohol use, NSAID use. Recently put on flagyl for BV and says her symptoms have somewhat improved. Recently started on prilosec again with no improvement. No stool change or fever or chills. Was vomiting at night and after eating but that has improved. No jaundice. No improvement with over the counter medications or other home remedies. Denies other complaint or health concern today.   She has an ultrasound on Tuesday for her GB.   She tried miralax; this seemed to cause her to stop having BM.s.   She then tried mag citrate, an enema and miralax again.  Has not had a stool in 4 days per her report. However today she did pass mucus.   She states that "I don't feel good, but I haven't felt good for months."   Mentions that she had surgery on her nose 2 months ago and became  constipated.  She then had an episode blood and mucus in her stool.  She had a GI work- up including endoscopy/ colonoscopy more than 10 years ago.  She was dx with IBS.  This was not done in [place].    At the end of the visit she also notes that she will have "pains in my chest" for the last year.  They may last a minute or so.  Non- exertional.  She has never mentioned this to a doctor before.  The pains feel sharp  Wt Readings from Last 3 Encounters:  08/26/13 163 lb (73.936 kg)  08/22/13 166 lb 3.2 oz (75.388 kg)  05/23/13 155 lb (70.308 kg)     Patient Active Problem List   Diagnosis Date Noted  . GERD (gastroesophageal reflux disease) 08/22/2013  . 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 ALLERGIC RHINITIS 06/26/2008  . DENTAL CARIES 06/26/2008    Past Medical History  Diagnosis Date  . Panic attacks   . Abnormal Pap smear     2 colposcopys  . Depression   . Ulcer   . ADHD (attention deficit hyperactivity disorder)     Past Surgical History  Procedure Laterality Date  . Fracture surgery      on the R arm with metal rod in forearm  . Cosmetic surgery    . Nasal septum surgery    .  Breast surgery      Implants  . Rhinoplasty      History  Substance Use Topics  . Smoking status: Never Smoker   . Smokeless tobacco: Never Used  . Alcohol Use: No     Comment: once a week    No family history on file.  Allergies  Allergen Reactions  . Dilaudid [Hydromorphone Hcl]   . Hydrocodone-Acetaminophen   . Morphine And Related     Medication list has been reviewed and updated.  Current Outpatient Prescriptions on File Prior to Visit  Medication Sig Dispense Refill  . clonazePAM (KLONOPIN) 0.5 MG tablet TAKE 1 TABLET BY MOUTH TWICE DAILY AS NEEDED FOR ANXIETY  30 tablet  0  . metroNIDAZOLE (FLAGYL) 250 MG tablet Take 250 mg by mouth 3 (three) times daily.      Marland Kitchen omeprazole (PRILOSEC) 10 MG capsule Take 20 mg by mouth daily.      . Thyroid  (NATURE-THROID PO) Take 1 tablet by mouth 2 (two) times daily.      Marland Kitchen desmopressin (DDAVP) 0.2 MG tablet Take 0.2 mg by mouth daily.       No current facility-administered medications on file prior to visit.    Review of Systems:  As per HPI- otherwise negative.  Physical Examination: Filed Vitals:   08/26/13 1619  BP: 122/74  Pulse: 76  Temp: 99.4 F (37.4 C)  Resp: 17   Filed Vitals:   08/26/13 1619  Height: 5' 2.5" (1.588 m)  Weight: 163 lb (73.936 kg)   Body mass index is 29.32 kg/(m^2). Ideal Body Weight: Weight in (lb) to have BMI = 25: 138.6  GEN: WDWN, NAD, Non-toxic, A & O x 3 HEENT: Atraumatic, Normocephalic. Neck supple. No masses, No LAD.  Bilateral TM wnl, oropharynx normal.  PEERL,EOMI.   Ears and Nose: No external deformity. CV: RRR, No M/G/R. No JVD. No thrill. No extra heart sounds. PULM: CTA B, no wheezes, crackles, rhonchi. No retractions. No resp. distress. No accessory muscle use. ABD: S, ND, +increased BS. No rebound. No HSM.  Abdomen is mildly tender over all quadrants EXTR: No c/c/e NEURO Normal gait.  PSYCH: Normally interactive. Conversant. Seems unhappy and stressed  I am able to reproduce the pain she has noted in her chest by pressing on the left side of her chest wall.  She states this is the same pains she has noted over the last year.  Of note she had a PA chest film on 10/13.    Results for orders placed in visit on 08/26/13  POCT CBC      Result Value Range   WBC 4.7  4.6 - 10.2 K/uL   Lymph, poc 2.0  0.6 - 3.4   POC LYMPH PERCENT 42.8  10 - 50 %L   MID (cbc) 0.4  0 - 0.9   POC MID % 9.2  0 - 12 %M   POC Granulocyte 2.3  2 - 6.9   Granulocyte percent 48.0  37 - 80 %G   RBC 4.24  4.04 - 5.48 M/uL   Hemoglobin 13.8  12.2 - 16.2 g/dL   HCT, POC 16.1  09.6 - 47.9 %   MCV 99.0 (*) 80 - 97 fL   MCH, POC 32.5 (*) 27 - 31.2 pg   MCHC 32.9  31.8 - 35.4 g/dL   RDW, POC 04.5     Platelet Count, POC 257  142 - 424 K/uL   MPV 9.6  0 -  99.8 fL   EKG: NSR, no ST elevation or depression Assessment and Plan: Abdominal  pain, other specified site - Plan: POCT CBC, Ambulatory referral to Gastroenterology  IBS (irritable bowel syndrome) - Plan: dicyclomine (BENTYL) 10 MG capsule  Chest pain - Plan: EKG 12-Lead  persistent abdominal pain.  CBC today is reassuring.  Proceed with ultrasound next week.  She likely has IBS and anxiety.  However also consider IBD.  Will refer to GI as well Bentyl may be helpful for her IBS sx.  At this time she is feeing constipated so will defer use, but did give her an rx to hold and use if her constipation gets better.  She can use this prn CP: apparently MSK in origin.  VS and EKG are reassuring, and I can reproduce her pain by pressing on chest wall.  She relates that she has had this pain for a year so doubt any dangerous etiology.   Signed Abbe Amsterdam, MD

## 2013-08-29 ENCOUNTER — Encounter: Payer: Self-pay | Admitting: Nurse Practitioner

## 2013-08-30 ENCOUNTER — Other Ambulatory Visit: Payer: BC Managed Care – PPO

## 2013-08-31 ENCOUNTER — Ambulatory Visit: Payer: BC Managed Care – PPO | Admitting: Physician Assistant

## 2013-09-05 ENCOUNTER — Telehealth: Payer: Self-pay | Admitting: Family Medicine

## 2013-09-05 ENCOUNTER — Other Ambulatory Visit: Payer: Self-pay | Admitting: Emergency Medicine

## 2013-09-05 ENCOUNTER — Ambulatory Visit
Admission: RE | Admit: 2013-09-05 | Discharge: 2013-09-05 | Disposition: A | Discharge status: Home / Self Care | Payer: BC Managed Care – PPO | Source: Ambulatory Visit | Attending: Emergency Medicine | Admitting: Emergency Medicine

## 2013-09-05 DIAGNOSIS — R109 Unspecified abdominal pain: Secondary | ICD-10-CM

## 2013-09-05 DIAGNOSIS — G8929 Other chronic pain: Secondary | ICD-10-CM

## 2013-09-05 NOTE — Telephone Encounter (Signed)
Patient notified and voiced understanding.

## 2013-09-05 NOTE — Telephone Encounter (Signed)
Patient needs U/S results today. Please advise

## 2013-09-05 NOTE — Telephone Encounter (Signed)
Abd U/S is normal.  Proceed with GI referral as planned

## 2013-09-06 ENCOUNTER — Ambulatory Visit: Payer: BC Managed Care – PPO | Admitting: Physician Assistant

## 2013-09-06 ENCOUNTER — Encounter: Payer: Self-pay | Admitting: Physician Assistant

## 2013-09-06 ENCOUNTER — Ambulatory Visit (INDEPENDENT_AMBULATORY_CARE_PROVIDER_SITE_OTHER): Payer: BC Managed Care – PPO | Admitting: Physician Assistant

## 2013-09-06 ENCOUNTER — Other Ambulatory Visit (INDEPENDENT_AMBULATORY_CARE_PROVIDER_SITE_OTHER): Payer: BC Managed Care – PPO

## 2013-09-06 VITALS — BP 100/48 | HR 68 | Ht 64.0 in | Wt 165.0 lb

## 2013-09-06 DIAGNOSIS — R1011 Right upper quadrant pain: Secondary | ICD-10-CM

## 2013-09-06 DIAGNOSIS — R194 Change in bowel habit: Secondary | ICD-10-CM

## 2013-09-06 DIAGNOSIS — G8929 Other chronic pain: Secondary | ICD-10-CM

## 2013-09-06 DIAGNOSIS — R141 Gas pain: Secondary | ICD-10-CM

## 2013-09-06 DIAGNOSIS — R140 Abdominal distension (gaseous): Secondary | ICD-10-CM

## 2013-09-06 DIAGNOSIS — R110 Nausea: Secondary | ICD-10-CM

## 2013-09-06 DIAGNOSIS — R198 Other specified symptoms and signs involving the digestive system and abdomen: Secondary | ICD-10-CM

## 2013-09-06 LAB — HEPATITIS B SURFACE ANTIGEN: Hepatitis B Surface Ag: NEGATIVE

## 2013-09-06 LAB — B12 AND FOLATE PANEL
Folate: 11 ng/mL (ref >5.9)
Vitamin B-12: 1268 pg/mL — ABNORMAL HIGH (ref 211–911)

## 2013-09-06 LAB — HEPATITIS C ANTIBODY: HCV Ab: NEGATIVE

## 2013-09-06 MED ORDER — GLYCOPYRROLATE 2 MG PO TABS: ORAL_TABLET | ORAL | Status: DC

## 2013-09-06 NOTE — Patient Instructions (Signed)
Please go to the basement level to have your labs drawn.  We sent a prescription for the Robinul Forte, take 1 tab every morning. Do not take the dicyclomine ( Bentyl ).   We made you a follow up appointment with Mike Gip PA-C on 09-27-2013 at 10 am.

## 2013-09-06 NOTE — Progress Notes (Signed)
Reviewed, I will see in follow up.

## 2013-09-06 NOTE — Progress Notes (Signed)
Subjective:    Patient ID: Kirsten Fox, female    DOB: September 13, 1986, 27 y.o.   MRN: [id number]  HPI  Kirsten Fox is a pleasant 27 year old white female new to GI today referred by her primary care physician/urgent care [place]. Patient has history of chronic anxiety/depression, ADD and has been told that she has IBS. She is also on chronic PPI for hoarseness felt secondary to GERD. Patient relates that she underwent a GI workup as a teenager with endoscopy and colonoscopy while living in [place] was told that everything was negative other than IBS. She has more recently undergone a workup with a cholestatic practice in [place] and was told that she has IBS, leaky bowel syndrome, and food allergies to dairy and wheat casein gluten and fish. She was also told that she had a duodenal stress and had actually been placed on hydrocortisone her short period of time which she said made her feel horrible and she stopped it. From a GI standpoint she says she has felt "awful" over the past 6 months with ongoing issues with abdominal pain and bloating as well as distention. She says she usually feels best in the morning before she eats and worse as the day goes on. She has been trying to follow a lactose-free and mostly gluten-free diet but says her symptoms persist. He has no current complaints of heartburn indigestion dysphagia oral diet aphasia. She had undergone a rhinoplasty about 2 months ago, and after surgery while taking pain medication had become constipated and then had problems with small amounts of bright red blood and mucus in her stool over a 3 to four-day period which has since resolved. She says her bowels are almost back to normal though not having a bowel movement every day. She had also recently taken a course of Flagyl for a back serial vaginosis. She had been given a trial of dicyclomine by primary care for her GI symptoms but says that she has not tried this. She complains of postprandial abdominal  discomfort in the midabdomen as well as significant bloating and then nausea without vomiting. Her weight has been stable. Recent labs with H. pylori antibody negative LFTs were done and were mildly elevated with an AST of 38 ALT of 67 CBC is normal with hemoglobin of 12.3 but MCV of 99.8. She had an upper ultrasound done earlier this week which showed slightly contracted gallbladder but no gallstones and otherwise negative study. HIDA scan has been ordered.     Review of Systems  Constitutional: Negative.   HENT: Positive for voice change.   Eyes: Negative.   Respiratory: Negative.   Cardiovascular: Negative.   Gastrointestinal: Positive for nausea, abdominal pain, constipation and abdominal distention.  Endocrine: Negative.   Genitourinary: Positive for enuresis.  Musculoskeletal: Negative.   Allergic/Immunologic: Negative.   Neurological: Negative.   Hematological: Negative.   Psychiatric/Behavioral: The patient is nervous/anxious.    Outpatient Prescriptions Prior to Visit  Medication Sig Dispense Refill  . desmopressin (DDAVP) 0.2 MG tablet Take 0.2 mg by mouth daily.      Marland Kitchen dicyclomine (BENTYL) 10 MG capsule Take 1 capsule (10 mg total) by mouth 4 (four) times daily -  before meals and at bedtime. Use as needed for stomach upset  40 capsule  0  . omeprazole (PRILOSEC) 10 MG capsule Take 20 mg by mouth daily.      . Thyroid (NATURE-THROID PO) Take 1 tablet by mouth 2 (two) times daily.      . clonazePAM (KLONOPIN)  0.5 MG tablet TAKE 1 TABLET BY MOUTH TWICE DAILY AS NEEDED FOR ANXIETY  30 tablet  0   No facility-administered medications prior to visit.   Allergies  Allergen Reactions  . Dilaudid [Hydromorphone Hcl]   . Hydrocodone-Acetaminophen   . Morphine And Related    Patient Active Problem List   Diagnosis Date Noted  . GERD (gastroesophageal reflux disease) 08/22/2013  . 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 ALLERGIC RHINITIS 06/26/2008  . DENTAL  CARIES 06/26/2008   History  Substance Use Topics  . Smoking status: Never Smoker   . Smokeless tobacco: Never Used  . Alcohol Use: No     Comment: once a week   Family History  Problem Relation Age of Onset  . Breast cancer Paternal Grandmother   . Liver disease Father   . Alcohol abuse Father    History  Substance Use Topics  . Smoking status: Never Smoker   . Smokeless tobacco: Never Used  . Alcohol Use: No     Comment: once a week       Objective:   Physical Exam Well-developed young white female in no acute distress, pleasant blood pressure 100/48 pulse 68 height 5 foot 4 weight 165. HEENT nontraumatic normocephalic EOMI PERRLA sclera anicteric, Supple no JVD, Cardiovascular regular rate and rhythm with S1-S2 no murmur or gallop, Pulmonary clear bilaterally, Abdomen soft and is mild bilateral lower cautery tenderness no guarding or rebound no palpable mass or hepatosplenomegaly she does have multiple tattoos, Rectal exam not done, Extremities no clubbing cyanosis or edema skin warm and dry, Psych mood and affect normal and appropriate       Assessment & Plan:  # 12  27 year old white female with chronic GI symptoms and exacerbation over the past 6 months with persistent postprandial abdominal pain and bloating with intermittent nausea. Will need to rule out IBS versus IBD and also rule out celiac disease #2 GERD manifested by hoarseness #3 chronic anxiety/depression #4 ADD #5 mild transaminitis-patient with multiple tattoos will check for hepatitis B and C. #6 macrocytosis- rule out B12/folate deficiency  Plan; check CRP, B12 level folate level hepatitis C antibody and hepatitis B surface antigen as well as celiac panel Will await scheduled HIDA scan Try Robinul Forte 2 mg by mouth every morning which will be less sedating and less constipating  than dicyclomine She will continue a lactose-free diet Plan office followup in 3 weeks-she may require further imaging with CT  scan depending on results of above.

## 2013-09-07 LAB — CELIAC PANEL 10
Endomysial Screen: NEGATIVE
IgA: 128 mg/dL (ref 69–380)

## 2013-09-21 ENCOUNTER — Encounter (HOSPITAL_COMMUNITY): Payer: Self-pay

## 2013-09-21 ENCOUNTER — Encounter (HOSPITAL_COMMUNITY)
Admission: RE | Admit: 2013-09-21 | Discharge: 2013-09-21 | Disposition: A | Discharge status: Home / Self Care | Payer: BC Managed Care – PPO | Source: Ambulatory Visit | Attending: Emergency Medicine | Admitting: Emergency Medicine

## 2013-09-21 DIAGNOSIS — R1013 Epigastric pain: Secondary | ICD-10-CM | POA: Insufficient documentation

## 2013-09-21 DIAGNOSIS — G8929 Other chronic pain: Secondary | ICD-10-CM | POA: Insufficient documentation

## 2013-09-21 MED ORDER — TECHNETIUM TC 99M MEBROFENIN IV KIT
4.9000 | PACK | Freq: Once | INTRAVENOUS | Status: AC | PRN
  Administered 2013-09-21: 4.9 via INTRAVENOUS

## 2013-09-27 ENCOUNTER — Ambulatory Visit (INDEPENDENT_AMBULATORY_CARE_PROVIDER_SITE_OTHER): Payer: BC Managed Care – PPO | Admitting: Physician Assistant

## 2013-09-27 ENCOUNTER — Encounter: Payer: Self-pay | Admitting: Physician Assistant

## 2013-09-27 VITALS — BP 100/60 | HR 78 | Ht 64.0 in | Wt 168.8 lb

## 2013-09-27 DIAGNOSIS — R141 Gas pain: Secondary | ICD-10-CM

## 2013-09-27 DIAGNOSIS — F411 Generalized anxiety disorder: Secondary | ICD-10-CM

## 2013-09-27 DIAGNOSIS — R140 Abdominal distension (gaseous): Secondary | ICD-10-CM

## 2013-09-27 DIAGNOSIS — R109 Unspecified abdominal pain: Secondary | ICD-10-CM

## 2013-09-27 NOTE — Patient Instructions (Signed)
You have been scheduled for an endoscopy with propofol. Please follow written instructions given to you at your visit today. If you use inhalers (even only as needed), please bring them with you on the day of your procedure. Your physician has requested that you go to www.startemmi.com and enter the access code given to you at your visit today. This web site gives a general overview about your procedure. However, you should still follow specific instructions given to you by our office regarding your preparation for the procedure. You have been given Restora to take once daily for 1 month. We have given you information on Low gas diet and FODMAP diet

## 2013-09-27 NOTE — Progress Notes (Signed)
Reviewed and agree.

## 2013-09-27 NOTE — Progress Notes (Signed)
Subjective:    Patient ID: Kirsten Fox, female    DOB: [birth year], 27 y.o.   MRN: [id number]  HPI  Kirsten Fox is a pleasant 27 year old female who was seen as a new patient on 09/06/2013. She has history of chronic anxiety/depression, ADD and had been told she had IBS. Should also been on chronic PPI for hoarseness which had been felt secondary to GERD. She had complaints 06 month history of ongoing issues with abdominal discomfort and upper abdominal bloating as well as distention. She described increased symptoms progressively today with eating. She been trying to follow a lactose-free diet and mostly gluten-free diet that her symptoms were persistent. Her weight had been stable she had some intermittent problems with constipation. She was noted to have very minimal transaminitis with an AST of 38 ALT of 67. Upper abdominal ultrasound had been done prior to referral carotis of the showed a contracted gallbladder but no gallstones. She comes back in today for followup. She has since had a HIDA scan which showed a normal EF of 56%. She has had screening for hepatitis B and C. both are negative celiac panel is negative and B12 and folate were within normal limits. She had been given a trial of Robinul Forte but says she's not this made her more bloated so she stopped it. She says overall she doesn't feel as bad as she did at the time of her last visit. She seems to be having more episodes of abdominal bloating and discomfort that are occurring at least once each daily and lasting for a couple of hours though she says she's not feeling good discomfort although along like she was. She continues to follow a mostly lactose-free diet. She also mentions concerns today about her hormone levels. She says she is growing some facial hair which is disturbing her and that she had Dr. her gynecologist who did not want to draw any hormone levels.    Review of Systems  Constitutional: Negative.   HENT: Negative.   Eyes:  Negative.   Respiratory: Negative.   Cardiovascular: Negative.   Gastrointestinal: Positive for abdominal pain and abdominal distention.  Endocrine: Negative.   Genitourinary: Negative.   Musculoskeletal: Negative.   Allergic/Immunologic: Negative.   Neurological: Negative.   Hematological: Negative.   Psychiatric/Behavioral: Negative.    Outpatient Prescriptions Prior to Visit  Medication Sig Dispense Refill  . clonazePAM (KLONOPIN) 0.5 MG tablet TAKE 1 TABLET BY MOUTH TWICE DAILY AS NEEDED FOR ANXIETY  30 tablet  0  . desmopressin (DDAVP) 0.2 MG tablet Take 0.2 mg by mouth daily.      Marland Kitchen dicyclomine (BENTYL) 10 MG capsule Take 1 capsule (10 mg total) by mouth 4 (four) times daily -  before meals and at bedtime. Use as needed for stomach upset  40 capsule  0  . omeprazole (PRILOSEC) 10 MG capsule Take 20 mg by mouth daily.      . Thyroid (NATURE-THROID PO) Take 1 tablet by mouth 2 (two) times daily.      Marland Kitchen glycopyrrolate (ROBINUL) 2 MG tablet Take 1 tab every morning for bloating and spasms  30 tablet  2   No facility-administered medications prior to visit.   Allergies  Allergen Reactions  . Dilaudid [Hydromorphone Hcl]   . Hydrocodone-Acetaminophen   . Morphine And Related    Patient Active Problem List   Diagnosis Date Noted  . Generalized anxiety disorder 09/27/2013  . GERD (gastroesophageal reflux disease) 08/22/2013  . Dysplasia of cervix, low grade (CIN  1) 02/25/2012  . ALLERGIC RHINITIS 06/26/2008  . DENTAL CARIES 06/26/2008   History  Substance Use Topics  . Smoking status: Never Smoker   . Smokeless tobacco: Never Used  . Alcohol Use: No     Comment: once a week   family history includes Alcohol abuse in her father; Breast cancer in her paternal grandmother; Liver disease in her father.     Objective:   Physical Exam  well-developed young white female in no acute distress, pleasant blood pressure 100/60 pulse 78 height 5 foot 4 weight 168. HEENT; nontraumatic  normocephalic EOMI PERRLA sclera anicteric, Supple; no JVD, Cardiovascular; regular rate and rhythm with S1-S2 no murmur or gallop, Pulmonary; clear bilaterally, Abdomen; soft mildly tender in the epigastrium nondistended no palpable mass or hepatosplenomegaly ,BS sounds are present, Rectal; exam not done, Extremities; no clubbing cyanosis or edema skin warm and dry, Psych ;mood and affect normal and appropriate.        Assessment & Plan:   #39  27 year old female with persistent complaints of upper abdominal bloating and discomfort. Negative workup thus far. Suspect her symptoms are secondary to IBS/dyspepsia but will need to rule out peptic ulcer disease and/or gastroparesis.  Plan; patient is given copies of a low gas diet today as well as a FODMAP diet which she is encouraged to review and give herself a trial with food group elimination Will schedule for upper endoscopy with Dr. Hermelinda Medicus 20 patient detail and she is agreeable to proceed. Start trial of probiotic, samples given today

## 2013-10-05 ENCOUNTER — Telehealth: Payer: Self-pay | Admitting: Radiology

## 2013-10-05 NOTE — Telephone Encounter (Signed)
Patient called, she has seen the GI doctor, is to have an endoscopy; she had episode of severe pain, but is much better today. She states she will return if the severe pain returns or go to the ER.  She indicates she is waiting on a referral to Rheumatology, has not heard back, I see nothing about Rheumatology, am I missing something?

## 2013-10-05 NOTE — Telephone Encounter (Signed)
I also am not aware of a rheum referral.  Could you please call her and ask? Thanks- JC

## 2013-10-07 NOTE — Telephone Encounter (Signed)
Called her the only referral is to the Gastroenterologist. She states she wants to know if you can refer to Endocrinology. She has previously seen a holistic endocrinologist who told her she was having trouble with her adrenal glands. She did not want to go back to him. She was asking for a referral regarding this. I have encouraged her to return for this, so we can check labs, since was greater than 6 months since she had these abnormal labs. Advised her to bring copies of the labs with her. Patient agrees. She will come in to see [place] with a copy of the previous labs. To you FYI

## 2013-10-21 ENCOUNTER — Encounter: Payer: BC Managed Care – PPO | Admitting: Internal Medicine

## 2013-10-21 ENCOUNTER — Telehealth: Payer: Self-pay | Admitting: Internal Medicine

## 2013-10-21 NOTE — Telephone Encounter (Signed)
She has been having " miscommunications " lately, see telephone notes. Please charge late cancellation fee. She mentioned that she was not having any pain now, so  She probably just did not keep her appointment.

## 2013-11-21 ENCOUNTER — Ambulatory Visit (INDEPENDENT_AMBULATORY_CARE_PROVIDER_SITE_OTHER): Payer: BC Managed Care – PPO | Admitting: Family Medicine

## 2013-11-21 VITALS — BP 118/64 | HR 78 | Temp 98.6°F | Resp 16 | Ht 65.0 in | Wt 160.0 lb

## 2013-11-21 DIAGNOSIS — F411 Generalized anxiety disorder: Secondary | ICD-10-CM

## 2013-11-21 MED ORDER — ALPRAZOLAM 0.5 MG PO TABS: ORAL_TABLET | ORAL | Status: DC

## 2013-11-21 NOTE — Progress Notes (Signed)
[facility] [address] [place] [address], [place] [address] [phone]  Date:  11/21/2013   Name:  Kirsten Fox Fox   DOB:  [birth year]   MRN:  [id number]  PCP:  No PCP Per Patient    Chief Complaint: Medication Refill and Panic Attack   History of Present Illness:  Kirsten Fox is a 28 y.o. very pleasant female patient who presents with the following:  She is here today with her mother.  She has a lot of anxiety right now.  She is seeing a therapist who is planning to help her see a psychiatrist.  Her therapist is at [facility].  She is seen once a week and will see her tonight.    She has a lot of stress at home and "it's not anything I can get out of right now." She is living with her boyfriend and they are not getting along.  However right now she cannot afford to make him leave the home so he is still there.  Her home is a one bedroom so she cannot get a roommate very easily.  She is trying to get another job so she can afford to have him leave.  She and her BF are trying to work things out between them but she does not think this will be successful.  They have been together for about 14 months.   She has used klonopin in the past, and also xanax.  She felt xanax was more helpful for panic type sx in the past.    She feels like she is on the vergo of having panic attacks which she had in the past- about one year ago.    LMP started today   She recently strated on topamax for migraine HA.   No SI or HI.    Patient Active Problem List   Diagnosis Date Noted  . Generalized anxiety disorder 09/27/2013  . GERD (gastroesophageal reflux disease) 08/22/2013  . 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 ALLERGIC RHINITIS 06/26/2008  . DENTAL CARIES 06/26/2008    Past Medical History  Diagnosis Date  . Panic attacks   . Abnormal Pap smear     2 colposcopys  . Depression   . Ulcer   . ADHD (attention deficit hyperactivity disorder)      Past Surgical History  Procedure Laterality Date  . Fracture surgery      on the R arm with metal rod in forearm  . Cosmetic surgery    . Nasal septum surgery    . Breast surgery      Implants  . Rhinoplasty      History  Substance Use Topics  . Smoking status: Never Smoker   . Smokeless tobacco: Never Used  . Alcohol Use: No     Comment: once a week    Family History  Problem Relation Age of Onset  . Breast cancer Paternal Grandmother   . Liver disease Father   . Alcohol abuse Father     Allergies  Allergen Reactions  . Dilaudid [Hydromorphone Hcl]   . Hydrocodone-Acetaminophen   . Morphine And Related     Medication list has been reviewed and updated.  No current outpatient prescriptions on file prior to visit.   No current facility-administered medications on file prior to visit.    Review of Systems:  As per HPI- otherwise negative.   Physical Examination: Filed Vitals:   11/21/13 1408  BP: 118/64  Pulse: 78  Temp: 98.6 F (37 C)  Resp: 16   Filed Vitals:   11/21/13 1408  Fox: 5\' 5"  (1.651 m)  Weight: 160 lb (72.576 kg)   Body mass index is 26.63 kg/(m^2). Ideal Body Weight: Weight in (lb) to have BMI = 25: 149.9  GEN: WDWN, NAD, Non-toxic, A & O x 3 HEENT: Atraumatic, Normocephalic. Neck supple. No masses, No LAD. Ears and Nose: No external deformity. CV: RRR, No M/G/R. No JVD. No thrill. No extra heart sounds. PULM: CTA B, no wheezes, crackles, rhonchi. No retractions. No resp. distress. No accessory muscle use. EXTR: No c/c/e NEURO Normal gait.  PSYCH: Normally interactive. Conversant. Seems stressed  Assessment and Plan: GAD (generalized anxiety disorder) - Plan: ALPRAZolam (XANAX) 0.5 MG tablet  Anxiety due to home stressors. She is seeing a counselor and will hopefully soon see a psychiatrist.   She may use a low dose of xanax as needed   Meds ordered this encounter  Medications  . topiramate (TOPAMAX) 50 MG tablet     Sig: Take 50 mg by mouth 2 (two) times daily.  Marland Kitchen DIGESTIVE ENZYMES PO    Sig: Take by mouth.  . Multiple Vitamins-Minerals (HAIR VITAMINS PO)    Sig: Take by mouth.  . ALPRAZolam (XANAX) 0.5 MG tablet    Sig: Take 1/2 tablet up to three times a day as needed for anxiety    Dispense:  50 tablet    Refill:  0     Signed Abbe Amsterdam, MD

## 2013-11-21 NOTE — Patient Instructions (Signed)
I hope that things get better for you soon.  Continue to see your counselor.  Try the xanax as needed- remember it can make you feel sedated.  Let me know if there is anything we can do to help.

## 2014-01-04 ENCOUNTER — Encounter: Payer: Self-pay | Admitting: Internal Medicine

## 2014-01-04 ENCOUNTER — Other Ambulatory Visit: Payer: Self-pay | Admitting: *Deleted

## 2014-01-04 ENCOUNTER — Ambulatory Visit (AMBULATORY_SURGERY_CENTER): Payer: BC Managed Care – PPO | Admitting: Internal Medicine

## 2014-01-04 ENCOUNTER — Telehealth: Payer: Self-pay | Admitting: *Deleted

## 2014-01-04 VITALS — BP 118/73 | HR 106 | Temp 97.6°F | Resp 21 | Ht 64.0 in | Wt 168.0 lb

## 2014-01-04 DIAGNOSIS — R1906 Epigastric swelling, mass or lump: Secondary | ICD-10-CM

## 2014-01-04 DIAGNOSIS — K3184 Gastroparesis: Secondary | ICD-10-CM

## 2014-01-04 DIAGNOSIS — R109 Unspecified abdominal pain: Secondary | ICD-10-CM

## 2014-01-04 MED ORDER — OMEPRAZOLE 40 MG PO CPDR: 40.0000 mg | DELAYED_RELEASE_CAPSULE | Freq: Every day | ORAL | Status: DC

## 2014-01-04 MED ORDER — SODIUM CHLORIDE 0.9 % IV SOLN: 500.0000 mL | INTRAVENOUS | Status: DC

## 2014-01-04 NOTE — Patient Instructions (Signed)
YOU HAD AN ENDOSCOPIC PROCEDURE TODAY AT [facility]: Refer to the procedure report that was given to you for any specific questions about what was found during the examination.  If the procedure report does not answer your questions, please call your gastroenterologist to clarify.  If you requested that your care partner not be given the details of your procedure findings, then the procedure report has been included in a sealed envelope for you to review at your convenience later.  YOU SHOULD EXPECT: Some feelings of bloating in the abdomen. Passage of more gas than usual.  Walking can help get rid of the air that was put into your GI tract during the procedure and reduce the bloating. If you had a lower endoscopy (such as a colonoscopy or flexible sigmoidoscopy) you may notice spotting of blood in your stool or on the toilet paper. If you underwent a bowel prep for your procedure, then you may not have a normal bowel movement for a few days.  DIET: Your first meal following the procedure should be a light meal and then it is ok to progress to your normal diet.  A half-sandwich or bowl of soup is an example of a good first meal.  Heavy or fried foods are harder to digest and may make you feel nauseous or bloated.  Likewise meals heavy in dairy and vegetables can cause extra gas to form and this can also increase the bloating.  Drink plenty of fluids but you should avoid alcoholic beverages for 24 hours.  ACTIVITY: Your care partner should take you home directly after the procedure.  You should plan to take it easy, moving slowly for the rest of the day.  You can resume normal activity the day after the procedure however you should NOT DRIVE or use heavy machinery for 24 hours (because of the sedation medicines used during the test).    SYMPTOMS TO REPORT IMMEDIATELY: A gastroenterologist can be reached at any hour.  During normal business hours, 8:30 AM to 5:00 PM Monday through Friday,  call [phone].  After hours and on weekends, please call the GI answering service at [phone] who will take a message and have the physician on call contact you.     Following upper endoscopy (EGD)  Vomiting of blood or coffee ground material  New chest pain or pain under the shoulder blades  Painful or persistently difficult swallowing  New shortness of breath  Fever of 100F or higher  Black, tarry-looking stools  FOLLOW UP: If any biopsies were taken you will be contacted by phone or by letter within the next 1-3 weeks.  Call your gastroenterologist if you have not heard about the biopsies in 3 weeks.  Our staff will call the home number listed on your records the next business day following your procedure to check on you and address any questions or concerns that you may have at that time regarding the information given to you following your procedure. This is a courtesy call and so if there is no answer at the home number and we have not heard from you through the emergency physician on call, we will assume that you have returned to your regular daily activities without incident.  SIGNATURES/CONFIDENTIALITY: You and/or your care partner have signed paperwork which will be entered into your electronic medical record.  These signatures attest to the fact that that the information above on your After Visit Summary has been reviewed and is understood.    Full responsibility of the confidentiality of this discharge information lies with you and/or your care-partner.  prilosec 40mg . One daily.  Anti reflux measures   Gastroparesis diet.  Gastric emptying scan , Friday February 27th., at 0915, [facility].  Nothing to eat after midnight on February 26th. If you need to cancel due to weather, call [facility] at [phone].  Do not take any stomach or pain medications prior to this test. [phone].

## 2014-01-04 NOTE — Progress Notes (Signed)
Report to pacu rn, vss, bbs=clear 

## 2014-01-04 NOTE — Telephone Encounter (Signed)
Patient needs GES per Dr. Juanda ChanceBrodie for epigastric fullness. Spoke with Elnita Maxwellheryl and scheduled [facility] on 01/06/14 at 9:30 AM. NPO after midnight. NO stomach medications or opiate pain medications after midnight. Ardeen JourdainJane Scott at [facility] given appointment date, time and instructions to give to patient.

## 2014-01-04 NOTE — Progress Notes (Signed)
[facility]- Tyrone SagePaul Maholtz and Dr. Juanda ChanceBrodie made aware at 1540 that pt ate pasta at 930 today per K Tyrell RN  Ok to proceed with procedure as scheduled

## 2014-01-04 NOTE — Op Note (Signed)
[facility] [address] [place], [id number]   ENDOSCOPY PROCEDURE REPORT  PATIENT: Kirsten Fox, Kirsten Fox  MR#: [id number] BIRTHDATE: 09-03-1986 , 27  yrs. old GENDER: Female ENDOSCOPIST: Hart Carwinora M Brodie, MD REFERRED BY: Roni Breadobin [facility] PROCEDURE DATE:  01/04/2014 PROCEDURE:  EGD, diagnostic ASA CLASS:     Class I INDICATIONS:  epigastric fullness and discomfort associated with meals.  Upper abdominal ultrasound and HIDA scans are normal. History of ADD.   Epigastric pain. MEDICATIONS: MAC sedation, administered by CRNA and Propofol (Diprivan) 140 mg IV TOPICAL ANESTHETIC: Cetacaine Spray  DESCRIPTION OF PROCEDURE: After the risks benefits and alternatives of the procedure were thoroughly explained, informed consent was obtained.  The LB [id number] endoscope was introduced through the mouth and advanced to the second portion of the duodenum. Without limitations.  The instrument was slowly withdrawn as the mucosa was fully examined.      Esophagus: Esophageal mucosa appeared normal in proximal mid and distal esophagus. The Z line was normal. There was a stricture. There was no hiatal hernia Stomach: There was moderate amount of retained food along the greater curvature of the stomach. Gastric antrum was normal including pyloric outlet. Retroflexion of the scope revealed normal fundus and cardia Duodenum: Duodenal bulb and descending duodenum was unremarkable[          The scope was then withdrawn from the patient and the procedure completed.  COMPLICATIONS: There were no complications. ENDOSCOPIC IMPRESSION:  Retained food in the stomach suggestive of gastroparesis. No other abnormality RECOMMENDATIONS: antireflux measures Prilosec 20 mg daily Scheduled gastric emptying scan Gastroparesis diet Consider Reglan pending results of gastric emptying scan  REPEAT EXAM: no  eSigned:  Hart Carwinora M Brodie, MD 01/04/2014 4:32 PM   CC:  PATIENT  NAME:  Kirsten Fox, Kirsten Fox MR#: [id number]

## 2014-01-06 ENCOUNTER — Ambulatory Visit (HOSPITAL_COMMUNITY): Payer: BC Managed Care – PPO

## 2014-01-06 ENCOUNTER — Telehealth: Payer: Self-pay | Admitting: *Deleted

## 2014-01-06 NOTE — Telephone Encounter (Signed)
No answer, left message to call if questions or concerns. 

## 2014-01-14 ENCOUNTER — Encounter: Payer: Self-pay | Admitting: Family Medicine

## 2014-01-14 DIAGNOSIS — K3184 Gastroparesis: Secondary | ICD-10-CM | POA: Insufficient documentation

## 2014-01-18 ENCOUNTER — Ambulatory Visit (HOSPITAL_COMMUNITY)
Admission: RE | Admit: 2014-01-18 | Discharge: 2014-01-18 | Disposition: A | Discharge status: Home / Self Care | Payer: BC Managed Care – PPO | Source: Ambulatory Visit | Attending: Internal Medicine | Admitting: Internal Medicine

## 2014-01-18 DIAGNOSIS — R1906 Epigastric swelling, mass or lump: Secondary | ICD-10-CM

## 2014-01-18 DIAGNOSIS — R6881 Early satiety: Secondary | ICD-10-CM | POA: Insufficient documentation

## 2014-01-18 MED ORDER — TECHNETIUM TC 99M SULFUR COLLOID
2.0000 | Freq: Once | INTRAVENOUS | Status: AC | PRN
  Administered 2014-01-18: 2 via INTRAVENOUS

## 2014-01-24 ENCOUNTER — Encounter: Payer: Self-pay | Admitting: *Deleted

## 2014-01-26 ENCOUNTER — Telehealth: Payer: Self-pay | Admitting: Internal Medicine

## 2014-01-26 NOTE — Telephone Encounter (Signed)
Left a message for patient to call me. 

## 2014-01-27 NOTE — Telephone Encounter (Signed)
Left a message for patient to call me. 

## 2014-01-30 NOTE — Telephone Encounter (Signed)
Left a message for patient to call me. 

## 2014-01-31 ENCOUNTER — Telehealth: Payer: Self-pay | Admitting: *Deleted

## 2014-01-31 NOTE — Telephone Encounter (Signed)
Patient is calling because she wants to know what the next step is now that her GES was normal. Please, advise.

## 2014-01-31 NOTE — Telephone Encounter (Signed)
Please try Carafate slurry, 10 cc po bid x 10 days, #12 oz, 1 refill

## 2014-01-31 NOTE — Telephone Encounter (Signed)
Unable to reach patient by phone. Results mailed to patient.

## 2014-02-01 MED ORDER — SUCRALFATE 1 GM/10ML PO SUSP: ORAL | Status: DC

## 2014-02-01 NOTE — Telephone Encounter (Signed)
Patient notified of Dr. Brodie's recommendation 

## 2014-02-01 NOTE — Telephone Encounter (Signed)
Rx sent. Left a message for patient to call me. 

## 2014-02-20 ENCOUNTER — Telehealth: Payer: Self-pay | Admitting: Internal Medicine

## 2014-02-20 MED ORDER — HYOSCYAMINE SULFATE 0.125 MG SL SUBL: SUBLINGUAL_TABLET | SUBLINGUAL | Status: DC

## 2014-02-20 MED ORDER — ALUM HYDROXIDE-MAG TRISILICATE 80-14.2 MG PO CHEW: CHEWABLE_TABLET | ORAL | Status: DC

## 2014-02-20 NOTE — Telephone Encounter (Signed)
Please try Levsin SL.125 mg, #30 1 SL q 4-6 hrs prn bloating, indigestion. Also Gaviscon OTC in place of Carafate is good for belching/acid etc. Call back with an update

## 2014-02-20 NOTE — Telephone Encounter (Signed)
Patient states the Carafate is causing stomach pain and bloating. She is asking what she can try instead of this. Please, advise.

## 2014-02-20 NOTE — Telephone Encounter (Signed)
Spoke with patient and gave her recommendations. Rx sent to pharmacy. She will call with update.

## 2014-05-29 ENCOUNTER — Emergency Department: Payer: Self-pay | Admitting: Emergency Medicine

## 2014-06-01 ENCOUNTER — Ambulatory Visit: Payer: BC Managed Care – PPO | Admitting: Internal Medicine

## 2014-08-23 ENCOUNTER — Emergency Department: Payer: Self-pay | Admitting: Emergency Medicine

## 2015-03-21 ENCOUNTER — Inpatient Hospital Stay (HOSPITAL_COMMUNITY): Payer: Self-pay

## 2015-03-21 ENCOUNTER — Encounter (HOSPITAL_COMMUNITY): Payer: Self-pay | Admitting: *Deleted

## 2015-03-21 ENCOUNTER — Inpatient Hospital Stay (HOSPITAL_COMMUNITY)
Admission: AD | Admit: 2015-03-21 | Discharge: 2015-03-21 | Disposition: A | Discharge status: Home / Self Care | Payer: Self-pay | Source: Ambulatory Visit | Attending: Obstetrics & Gynecology | Admitting: Obstetrics & Gynecology

## 2015-03-21 DIAGNOSIS — R1032 Left lower quadrant pain: Secondary | ICD-10-CM | POA: Insufficient documentation

## 2015-03-21 DIAGNOSIS — O23591 Infection of other part of genital tract in pregnancy, first trimester: Secondary | ICD-10-CM

## 2015-03-21 DIAGNOSIS — A499 Bacterial infection, unspecified: Secondary | ICD-10-CM

## 2015-03-21 DIAGNOSIS — Z3A01 Less than 8 weeks gestation of pregnancy: Secondary | ICD-10-CM

## 2015-03-21 DIAGNOSIS — O26899 Other specified pregnancy related conditions, unspecified trimester: Secondary | ICD-10-CM

## 2015-03-21 DIAGNOSIS — R109 Unspecified abdominal pain: Secondary | ICD-10-CM

## 2015-03-21 DIAGNOSIS — O3680X0 Pregnancy with inconclusive fetal viability, not applicable or unspecified: Secondary | ICD-10-CM

## 2015-03-21 DIAGNOSIS — N760 Acute vaginitis: Secondary | ICD-10-CM

## 2015-03-21 DIAGNOSIS — O9989 Other specified diseases and conditions complicating pregnancy, childbirth and the puerperium: Secondary | ICD-10-CM

## 2015-03-21 DIAGNOSIS — B9689 Other specified bacterial agents as the cause of diseases classified elsewhere: Secondary | ICD-10-CM

## 2015-03-21 HISTORY — DX: Headache: R51

## 2015-03-21 HISTORY — DX: Headache, unspecified: R51.9

## 2015-03-21 HISTORY — DX: Unspecified abnormal cytological findings in specimens from vagina: R87.629

## 2015-03-21 HISTORY — DX: Hypothyroidism, unspecified: E03.9

## 2015-03-21 LAB — HCG, QUANTITATIVE, PREGNANCY: hCG, Beta Chain, Quant, S: 7034 m[IU]/mL — ABNORMAL HIGH (ref <5)

## 2015-03-21 LAB — URINALYSIS, ROUTINE W REFLEX MICROSCOPIC
Bilirubin Urine: NEGATIVE
Glucose, UA: NEGATIVE mg/dL
HGB URINE DIPSTICK: NEGATIVE
Ketones, ur: NEGATIVE mg/dL
LEUKOCYTES UA: NEGATIVE
Nitrite: NEGATIVE
Protein, ur: NEGATIVE mg/dL
SPECIFIC GRAVITY, URINE: 1.025 (ref 1.005–1.030)
UROBILINOGEN UA: 0.2 mg/dL (ref 0.0–1.0)
pH: 7 (ref 5.0–8.0)

## 2015-03-21 LAB — WET PREP, GENITAL
Trich, Wet Prep: NONE SEEN
WBC WET PREP: NONE SEEN
YEAST WET PREP: NONE SEEN

## 2015-03-21 LAB — POCT PREGNANCY, URINE: PREG TEST UR: POSITIVE — AB

## 2015-03-21 LAB — CBC
HCT: 36 % (ref 36.0–46.0)
HEMOGLOBIN: 12.4 g/dL (ref 12.0–15.0)
MCH: 32.2 pg (ref 26.0–34.0)
MCHC: 34.4 g/dL (ref 30.0–36.0)
MCV: 93.5 fL (ref 78.0–100.0)
Platelets: 264 10*3/uL (ref 150–400)
RBC: 3.85 MIL/uL — ABNORMAL LOW (ref 3.87–5.11)
RDW: 12.8 % (ref 11.5–15.5)
WBC: 6.7 10*3/uL (ref 4.0–10.5)

## 2015-03-21 MED ORDER — METRONIDAZOLE 500 MG PO TABS: 500.0000 mg | ORAL_TABLET | Freq: Three times a day (TID) | ORAL | Status: AC

## 2015-03-21 NOTE — MAU Note (Signed)
Been having a lot of cramping, yesterday was so bad she was doubled over and she couldn't work.   +HPT 2 days ago, had confirmed at "any lab test now" yesterday, hormone level was ~ 2200.  Was unplanned, has a lot of hormone imbalance problems and is concerned.

## 2015-03-21 NOTE — MAU Provider Note (Signed)
History     CSN: [id number]  Arrival date and time: 03/21/15 1526   First Provider Initiated Contact with Patient 03/21/15 1629      Chief Complaint  Patient presents with  . Abdominal Pain  . Possible Pregnancy   Abdominal Pain This is a new problem. The current episode started yesterday. The onset quality is gradual. The problem occurs intermittently. The problem has been unchanged. The pain is located in the LLQ, RLQ and suprapubic region. The pain is mild. The quality of the pain is cramping. The abdominal pain does not radiate. Pertinent negatives include no constipation, diarrhea, fever, headaches, myalgias, nausea or vomiting. Nothing aggravates the pain. The pain is relieved by nothing.   This is a 29 y.o. female at [redacted]w[redacted]d who presents with c/o lower abdominal cramping x 2 days. States had a quant HCG done outside lab showing 2200. Denies bleeding.   RN note: Been having a lot of cramping, yesterday was so bad she was doubled over and she couldn't work. +HPT 2 days ago, had confirmed at "any lab test now" yesterday, hormone level was ~ 2200. Was unplanned, has a lot of hormone imbalance problems and is concerned.          OB History    Gravida Para Term Preterm AB TAB SAB Ectopic Multiple Living   1               Past Medical History  Diagnosis Date  . Panic attacks   . Abnormal Pap smear     2 colposcopys  . Depression   . Ulcer   . ADHD (attention deficit hyperactivity disorder)   . GERD (gastroesophageal reflux disease)   . 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 Thyroid disease     hypothyroid  . Vaginal Pap smear, abnormal   . Headache   . Hypothyroidism     Past Surgical History  Procedure Laterality Date  . Fracture surgery      on the R arm with metal rod in forearm  . Cosmetic surgery    . Nasal septum surgery    . Breast surgery      Implants  . Rhinoplasty    . Arm surgery      Family History  Problem Relation Age of  Onset  . Breast cancer Paternal Grandmother   . Liver disease Father   . Alcohol abuse Father   . Colon cancer Neg Hx   . Esophageal cancer Neg Hx   . Stomach cancer Neg Hx   . Rectal cancer Neg Hx     History  Substance Use Topics  . Smoking status: Never Smoker   . Smokeless tobacco: Former Neurosurgeon  . Alcohol Use: Yes     Comment: once a week when not pregnant     Allergies:  Allergies  Allergen Reactions  . Dilaudid [Hydromorphone Hcl] Other (See Comments)    Does not work for pt  . Hydrocodone-Acetaminophen Hives and Nausea Only    Does not work for pt  . Morphine And Related Other (See Comments)    Does not work.  . Topamax [Topiramate] Swelling and Other (See Comments)    Causes numbness in legs and arms    Prescriptions prior to admission  Medication Sig Dispense Refill Last Dose  . calcium carbonate (TUMS - DOSED IN MG ELEMENTAL CALCIUM) 500 MG chewable tablet Chew 6-8 tablets by mouth daily as needed for indigestion or heartburn.  Past Week at Unknown time  . DIGESTIVE ENZYMES PO Take by mouth.   Past Month at Unknown time  . diphenhydrAMINE (BENADRYL) 25 MG tablet Take 25-50 mg by mouth at bedtime as needed for allergies or sleep.   03/20/2015 at Unknown time  . ibuprofen (ADVIL,MOTRIN) 200 MG tablet Take 800 mg by mouth every 6 (six) hours as needed for headache or mild pain.   03/21/2015 at Unknown time  . Multiple Vitamins-Minerals (HAIR VITAMINS PO) Take by mouth.   Past Month at Unknown time  . ALPRAZolam (XANAX) 0.5 MG tablet Take 1/2 tablet up to three times a day as needed for anxiety (Patient not taking: Reported on 03/21/2015) 50 tablet 0 Not Taking at Unknown time  . Alum Hydroxide-Mag Trisilicate 80-14.2 MG CHEW Use as needed for belching/acid (Patient not taking: Reported on 03/21/2015) 224 each 0 Not Taking at Unknown time  . hyoscyamine (LEVSIN SL) 0.125 MG SL tablet Take one SL every 4-6 hours prn bloating, indigestion (Patient not taking: Reported on  03/21/2015) 30 tablet 0 Not Taking at Unknown time  . omeprazole (PRILOSEC) 40 MG capsule Take 1 capsule (40 mg total) by mouth daily. (Patient not taking: Reported on 03/21/2015) 30 capsule 3 Not Taking at Unknown time    Review of Systems  Constitutional: Negative for fever, chills and malaise/fatigue.  Gastrointestinal: Positive for abdominal pain. Negative for nausea, vomiting, diarrhea and constipation.  Musculoskeletal: Negative for myalgias and back pain.  Neurological: Negative for headaches.   Physical Exam   Blood pressure 126/56, pulse 70, temperature 98.2 F (36.8 C), temperature source Oral, resp. rate 20, height 5\' 3"  (1.6 m), weight 185 lb (83.915 kg), last menstrual period 02/09/2015.  Physical Exam  Constitutional: She is oriented to person, place, and time. She appears well-developed and well-nourished. No distress.  HENT:  Head: Normocephalic.  Cardiovascular: Normal rate, regular rhythm and normal heart sounds.  Exam reveals no gallop and no friction rub.   No murmur heard. Respiratory: Effort normal. No respiratory distress.  GI: Soft. She exhibits no distension. There is no tenderness. There is no rebound and no guarding.  Genitourinary: Vagina normal. No vaginal discharge found.  Cervix closed Uterus small less than 6 wk size No adnexal tenderness  Musculoskeletal: Normal range of motion.  Neurological: She is alert and oriented to person, place, and time.  Skin: Skin is warm and dry.  Psychiatric: She has a normal mood and affect.    MAU Course  Procedures  MDM Cultures done Quant HCG done US ordered to rule out ectopic pregnancy [place] Ob Comp Less 14 Wks  03/21/2015   CLINICAL DATA:  Low back and pelvic pain affecting pregnancy. Gestational age by LMP of 5 weeks 5 days.  EXAM: OBSTETRIC <14 WK US AND TRANSVAGINAL OB US  TECHNIQUE: Both transabdominal and transvaginal ultrasound examinations were performed for complete evaluation of the gestation as well as  the maternal uterus, adnexal regions, and pelvic cul-de-sac. Transvaginal technique was performed to assess early pregnancy.  COMPARISON:  None.  FINDINGS: Intrauterine gestational sac: Visualized/normal in shape.  Yolk sac:  Visualized  Embryo:  None visualized  MSD: 8  mm   5 w   3  d  Maternal uterus/adnexae: Small left ovarian corpus luteum cyst noted. Right ovary not directly visualized, however no adnexal mass or free fluid identified.  IMPRESSION: Single intrauterine gestational sac measuring 5 weeks 3 days by mean sac diameter. This is concordant with LMP. Recommend continued followup of quantitative  B-HCG levels, with follow-up US no earlier than 10 days to assess viability if warranted clinically.  No significant maternal uterine or adnexal abnormality identified.   Electronically Signed   By: Myles RosenthalJohn  Stahl M.D.   On: 03/21/2015 17:42   [place] Ob Transvaginal  03/21/2015   CLINICAL DATA:  Low back and pelvic pain affecting pregnancy. Gestational age by LMP of 5 weeks 5 days.  EXAM: OBSTETRIC <14 WK US AND TRANSVAGINAL OB US  TECHNIQUE: Both transabdominal and transvaginal ultrasound examinations were performed for complete evaluation of the gestation as well as the maternal uterus, adnexal regions, and pelvic cul-de-sac. Transvaginal technique was performed to assess early pregnancy.  COMPARISON:  None.  FINDINGS: Intrauterine gestational sac: Visualized/normal in shape.  Yolk sac:  Visualized  Embryo:  None visualized  MSD: 8  mm   5 w   3  d  Maternal uterus/adnexae: Small left ovarian corpus luteum cyst noted. Right ovary not directly visualized, however no adnexal mass or free fluid identified.  IMPRESSION: Single intrauterine gestational sac measuring 5 weeks 3 days by mean sac diameter. This is concordant with LMP. Recommend continued followup of quantitative B-HCG levels, with follow-up US no earlier than 10 days to assess viability if warranted clinically.  No significant maternal uterine or adnexal  abnormality identified.   Electronically Signed   By: Myles RosenthalJohn  Stahl M.D.   On: 03/21/2015 17:42   Results for orders placed or performed during the hospital encounter of 03/21/15 (from the past 72 hour(s))  GC/Chlamydia probe amp ([place])     Status: None   Collection Time: 03/21/15 12:00 AM  Result Value Ref Range   Chlamydia Negative     Comment: Normal Reference Range - Negative   Neisseria gonorrhea Negative     Comment: Normal Reference Range - Negative  Urinalysis, Routine w reflex microscopic     Status: None   Collection Time: 03/21/15  3:58 PM  Result Value Ref Range   Color, Urine YELLOW YELLOW   APPearance CLEAR CLEAR   Specific Gravity, Urine 1.025 1.005 - 1.030   pH 7.0 5.0 - 8.0   Glucose, UA NEGATIVE NEGATIVE mg/dL   Hgb urine dipstick NEGATIVE NEGATIVE   Bilirubin Urine NEGATIVE NEGATIVE   Ketones, ur NEGATIVE NEGATIVE mg/dL   Protein, ur NEGATIVE NEGATIVE mg/dL   Urobilinogen, UA 0.2 0.0 - 1.0 mg/dL   Nitrite NEGATIVE NEGATIVE   Leukocytes, UA NEGATIVE NEGATIVE    Comment: MICROSCOPIC NOT DONE ON URINES WITH NEGATIVE PROTEIN, BLOOD, LEUKOCYTES, NITRITE, OR GLUCOSE <1000 mg/dL.  Pregnancy, urine POC     Status: Abnormal   Collection Time: 03/21/15  4:01 PM  Result Value Ref Range   Preg Test, Ur POSITIVE (A) NEGATIVE    Comment:        THE SENSITIVITY OF THIS METHODOLOGY IS >24 mIU/mL   hCG, quantitative, pregnancy     Status: Abnormal   Collection Time: 03/21/15  4:40 PM  Result Value Ref Range   hCG, Beta Chain, Quant, S 7034 (H) <5 mIU/mL    Comment:          GEST. AGE      CONC.  (mIU/mL)   <=1 WEEK        5 - 50     2 WEEKS       50 - 500     3 WEEKS       100 - 10,000     4 WEEKS     1,000 -  30,000     5 WEEKS     3,500 - 115,000   6-8 WEEKS     12,000 - 270,000    12 WEEKS     15,000 - 220,000        FEMALE AND NON-PREGNANT FEMALE:     LESS THAN 5 mIU/mL   CBC     Status: Abnormal   Collection Time: 03/21/15  4:40 PM  Result Value Ref Range    WBC 6.7 4.0 - 10.5 K/uL   RBC 3.85 (L) 3.87 - 5.11 MIL/uL   Hemoglobin 12.4 12.0 - 15.0 g/dL   HCT 16.1 09.6 - 04.5 %   MCV 93.5 78.0 - 100.0 fL   MCH 32.2 26.0 - 34.0 pg   MCHC 34.4 30.0 - 36.0 g/dL   RDW 40.9 81.1 - 91.4 %   Platelets 264 150 - 400 K/uL  HIV antibody     Status: None   Collection Time: 03/21/15  4:40 PM  Result Value Ref Range   HIV Screen 4th Generation wRfx Non Reactive Non Reactive    Comment: (NOTE) Performed At: [facility] [address] [place], [place] [id number] Mila Homer MD [id number]   Wet prep, genital     Status: Abnormal   Collection Time: 03/21/15  4:45 PM  Result Value Ref Range   Yeast Wet Prep HPF POC NONE SEEN NONE SEEN   Trich, Wet Prep NONE SEEN NONE SEEN   Clue Cells Wet Prep HPF POC MODERATE (A) NONE SEEN   WBC, Wet Prep HPF POC NONE SEEN NONE SEEN    Comment: BACTERIA- TOO NUMEROUS TO COUNT     Assessment and Plan  A:  SIUP at [redacted]w[redacted]d confirmed by gestational sac size      Yolk sac seen which effectively rules out ectopic       Cramping  P:  Discharge home       Discussed findings       Encouraged to start prenatal care   [facility] 03/21/2015, 4:38 PM

## 2015-03-21 NOTE — Discharge Instructions (Signed)
Abdominal Pain During Pregnancy °Abdominal pain is common in pregnancy. Most of the time, it does not cause harm. There are many causes of abdominal pain. Some causes are more serious than others. Some of the causes of abdominal pain in pregnancy are easily diagnosed. Occasionally, the diagnosis takes time to understand. Other times, the cause is not determined. Abdominal pain can be a sign that something is very wrong with the pregnancy, or the pain may have nothing to do with the pregnancy at all. For this reason, always tell your health care provider if you have any abdominal discomfort. °HOME CARE INSTRUCTIONS  °Monitor your abdominal pain for any changes. The following actions may help to alleviate any discomfort you are experiencing: °· Do not have sexual intercourse or put anything in your vagina until your symptoms go away completely. °· Get plenty of rest until your pain improves. °· Drink clear fluids if you feel nauseous. Avoid solid food as long as you are uncomfortable or nauseous. °· Only take over-the-counter or prescription medicine as directed by your health care provider. °· Keep all follow-up appointments with your health care provider. °SEEK IMMEDIATE MEDICAL CARE IF: °· You are bleeding, leaking fluid, or passing tissue from the vagina. °· You have increasing pain or cramping. °· You have persistent vomiting. °· You have painful or bloody urination. °· You have a fever. °· You notice a decrease in your baby's movements. °· You have extreme weakness or feel faint. °· You have shortness of breath, with or without abdominal pain. °· You develop a severe headache with abdominal pain. °· You have abnormal vaginal discharge with abdominal pain. °· You have persistent diarrhea. °· You have abdominal pain that continues even after rest, or gets worse. °MAKE SURE YOU:  °· Understand these instructions. °· Will watch your condition. °· Will get help right away if you are not doing well or get  worse. °Document Released: 10/27/2005 Document Revised: 08/17/2013 Document Reviewed: 05/26/2013 °Exit[employer]® Patient Information ©2015 [employer]. This information is not intended to replace advice given to you by your health care provider. Make sure you discuss any questions you have with your health care provider. °First Trimester of Pregnancy °The first trimester of pregnancy is from week 1 until the end of week 12 (months 1 through 3). A week after a sperm fertilizes an egg, the egg will implant on the wall of the uterus. This embryo will begin to develop into a baby. Genes from you and your partner are forming the baby. The female genes determine whether the baby is a boy or a girl. At 6-8 weeks, the eyes and face are formed, and the heartbeat can be seen on ultrasound. At the end of 12 weeks, all the baby's organs are formed.  °Now that you are pregnant, you will want to do everything you can to have a healthy baby. Two of the most important things are to get good prenatal care and to follow your health care provider's instructions. Prenatal care is all the medical care you receive before the baby's birth. This care will help prevent, find, and treat any problems during the pregnancy and childbirth. °BODY CHANGES °Your body goes through many changes during pregnancy. The changes vary from woman to woman.  °· You may gain or lose a couple of pounds at first. °· You may feel sick to your stomach (nauseous) and throw up (vomit). If the vomiting is uncontrollable, call your health care provider. °· You may tire easily. °·   You may develop headaches that can be relieved by medicines approved by your health care provider. °· You may urinate more often. Painful urination may mean you have a bladder infection. °· You may develop heartburn as a result of your pregnancy. °· You may develop constipation because certain hormones are causing the muscles that push waste through your intestines to slow down. °· You may  develop hemorrhoids or swollen, bulging veins (varicose veins). °· Your breasts may begin to grow larger and become tender. Your nipples may stick out more, and the tissue that surrounds them (areola) may become darker. °· Your gums may bleed and may be sensitive to brushing and flossing. °· Dark spots or blotches (chloasma, mask of pregnancy) may develop on your face. This will likely fade after the baby is born. °· Your menstrual periods will stop. °· You may have a loss of appetite. °· You may develop cravings for certain kinds of food. °· You may have changes in your emotions from day to day, such as being excited to be pregnant or being concerned that something may go wrong with the pregnancy and baby. °· You may have more vivid and strange dreams. °· You may have changes in your hair. These can include thickening of your hair, rapid growth, and changes in texture. Some women also have hair loss during or after pregnancy, or hair that feels dry or thin. Your hair will most likely return to normal after your baby is born. °WHAT TO EXPECT AT YOUR PRENATAL VISITS °During a routine prenatal visit: °· You will be weighed to make sure you and the baby are growing normally. °· Your blood pressure will be taken. °· Your abdomen will be measured to track your baby's growth. °· The fetal heartbeat will be listened to starting around week 10 or 12 of your pregnancy. °· Test results from any previous visits will be discussed. °Your health care provider may ask you: °· How you are feeling. °· If you are feeling the baby move. °· If you have had any abnormal symptoms, such as leaking fluid, bleeding, severe headaches, or abdominal cramping. °· If you have any questions. °Other tests that may be performed during your first trimester include: °· Blood tests to find your blood type and to check for the presence of any previous infections. They will also be used to check for low iron levels (anemia) and Rh antibodies. Later in  the pregnancy, blood tests for diabetes will be done along with other tests if problems develop. °· Urine tests to check for infections, diabetes, or protein in the urine. °· An ultrasound to confirm the proper growth and development of the baby. °· An amniocentesis to check for possible genetic problems. °· Fetal screens for spina bifida and Down syndrome. °· You may need other tests to make sure you and the baby are doing well. °HOME CARE INSTRUCTIONS  °Medicines °· Follow your health care provider's instructions regarding medicine use. Specific medicines may be either safe or unsafe to take during pregnancy. °· Take your prenatal vitamins as directed. °· If you develop constipation, try taking a stool softener if your health care provider approves. °Diet °· Eat regular, well-balanced meals. Choose a variety of foods, such as meat or vegetable-based protein, fish, milk and low-fat dairy products, vegetables, fruits, and whole grain breads and cereals. Your health care provider will help you determine the amount of weight gain that is right for you. °· Avoid raw meat and uncooked cheese. These   carry germs that can cause birth defects in the baby. °· Eating four or five small meals rather than three large meals a day may help relieve nausea and vomiting. If you start to feel nauseous, eating a few soda crackers can be helpful. Drinking liquids between meals instead of during meals also seems to help nausea and vomiting. °· If you develop constipation, eat more high-fiber foods, such as fresh vegetables or fruit and whole grains. Drink enough fluids to keep your urine clear or pale yellow. °Activity and Exercise °· Exercise only as directed by your health care provider. Exercising will help you: °¨ Control your weight. °¨ Stay in shape. °¨ Be prepared for labor and delivery. °· Experiencing pain or cramping in the lower abdomen or low back is a good sign that you should stop exercising. Check with your health care  provider before continuing normal exercises. °· Try to avoid standing for long periods of time. Move your legs often if you must stand in one place for a long time. °· Avoid heavy lifting. °· Wear low-heeled shoes, and practice good posture. °· You may continue to have sex unless your health care provider directs you otherwise. °Relief of Pain or Discomfort °· Wear a good support bra for breast tenderness.   °· Take warm sitz baths to soothe any pain or discomfort caused by hemorrhoids. Use hemorrhoid cream if your health care provider approves.   °· Rest with your legs elevated if you have leg cramps or low back pain. °· If you develop varicose veins in your legs, wear support hose. Elevate your feet for 15 minutes, 3-4 times a day. Limit salt in your diet. °Prenatal Care °· Schedule your prenatal visits by the twelfth week of pregnancy. They are usually scheduled monthly at first, then more often in the last 2 months before delivery. °· Write down your questions. Take them to your prenatal visits. °· Keep all your prenatal visits as directed by your health care provider. °Safety °· Wear your seat belt at all times when driving. °· Make a list of emergency phone numbers, including numbers for family, friends, the hospital, and police and fire departments. °General Tips °· Ask your health care provider for a referral to a local prenatal education class. Begin classes no later than at the beginning of month 6 of your pregnancy. °· Ask for help if you have counseling or nutritional needs during pregnancy. Your health care provider can offer advice or refer you to specialists for help with various needs. °· Do not use hot tubs, steam rooms, or saunas. °· Do not douche or use tampons or scented sanitary pads. °· Do not cross your legs for long periods of time. °· Avoid cat litter boxes and soil used by cats. These carry germs that can cause birth defects in the baby and possibly loss of the fetus by miscarriage or  stillbirth. °· Avoid all smoking, herbs, alcohol, and medicines not prescribed by your health care provider. Chemicals in these affect the formation and growth of the baby. °· Schedule a dentist appointment. At home, brush your teeth with a soft toothbrush and be gentle when you floss. °SEEK MEDICAL CARE IF:  °· You have dizziness. °· You have mild pelvic cramps, pelvic pressure, or nagging pain in the abdominal area. °· You have persistent nausea, vomiting, or diarrhea. °· You have a bad smelling vaginal discharge. °· You have pain with urination. °· You notice increased swelling in your face, hands, legs, or ankles. °SEEK   IMMEDIATE MEDICAL CARE IF:  °· You have a fever. °· You are leaking fluid from your vagina. °· You have spotting or bleeding from your vagina. °· You have severe abdominal cramping or pain. °· You have rapid weight gain or loss. °· You vomit blood or material that looks like coffee grounds. °· You are exposed to German measles and have never had them. °· You are exposed to fifth disease or chickenpox. °· You develop a severe headache. °· You have shortness of breath. °· You have any kind of trauma, such as from a fall or a car accident. °Document Released: 10/21/2001 Document Revised: 03/13/2014 Document Reviewed: 09/06/2013 °ExitCare® Patient Information ©2015 [employer]. This information is not intended to replace advice given to you by your health care provider. Make sure you discuss any questions you have with your health care provider. ° °

## 2015-03-21 NOTE — MAU Note (Signed)
In addition to abdominal pain that started 2 weeks ago, patient also has lower back pain that worsened 2 days ago.

## 2015-03-22 LAB — GC/CHLAMYDIA PROBE AMP (~~LOC~~) NOT AT ARMC
Chlamydia: NEGATIVE
Neisseria Gonorrhea: NEGATIVE

## 2015-03-22 LAB — HIV ANTIBODY (ROUTINE TESTING W REFLEX): HIV Screen 4th Generation wRfx: NONREACTIVE

## 2015-05-01 IMAGING — CT CT HEAD WITHOUT CONTRAST
3 of 4 series · 11 of 27 positions shown, 13 images · non-contrast
Comparison: None.

CLINICAL DATA: Fall off horse

EXAM:
CT HEAD WITHOUT CONTRAST
CT CERVICAL SPINE WITHOUT CONTRAST
TECHNIQUE: Multidetector CT imaging of the head and cervical spine was
performed following the standard protocol without intravenous
contrast. Multiplanar CT image reconstructions of the cervical spine
were also generated.

[Series 3: head bone · axial · 0.41mm/px · z∈[+393,+421]mm · 2 of 96 slices shown]
[im 20/96  bone]
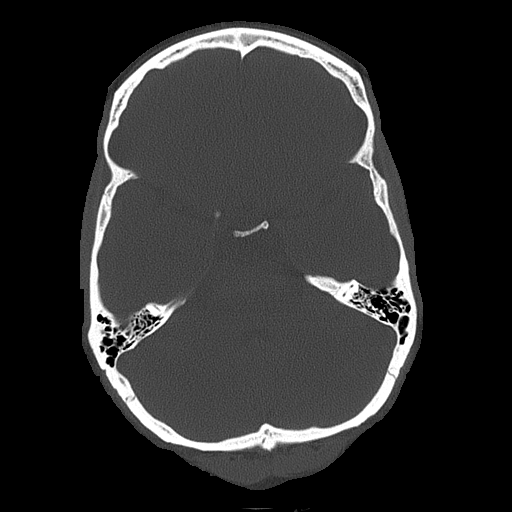
[im 39/96  bone]
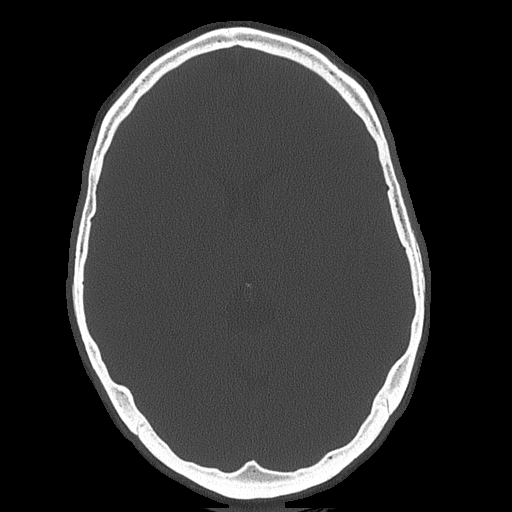

[Series 5: c spine soft · axial · 0.36mm/px · z∈[+250,+350]mm · 4 of 84 slices shown, 5 images]
[im 17/84  soft-tissue]
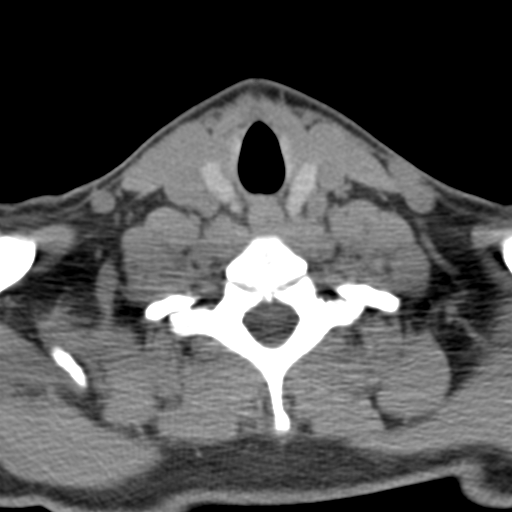
[im 17/84  bone]
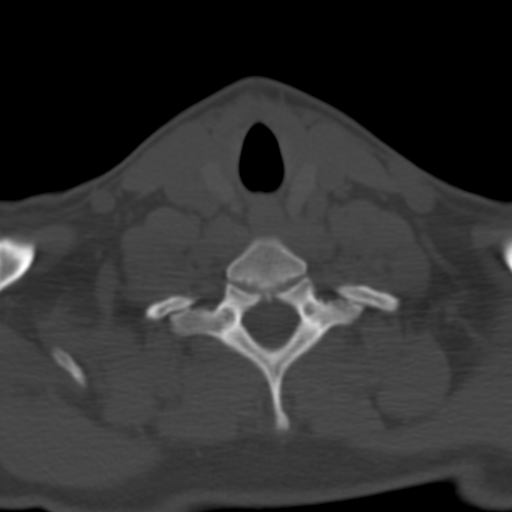
[im 34/84  bone]
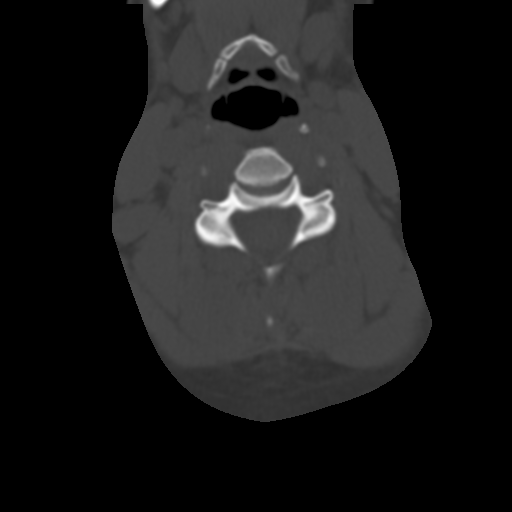
[im 50/84  bone]
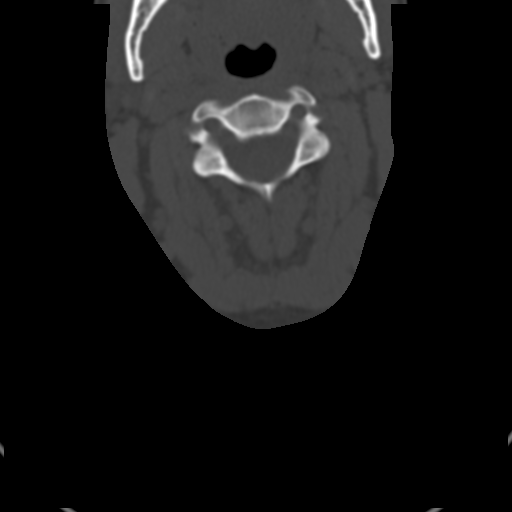
[im 67/84  bone]
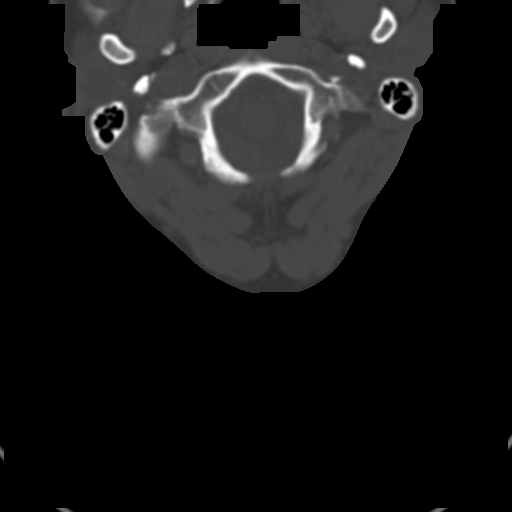

[Series 8: sag bone · sagittal · 0.17mm/px · 5 of 47 slices shown, 6 images]
[im 16/47  bone]
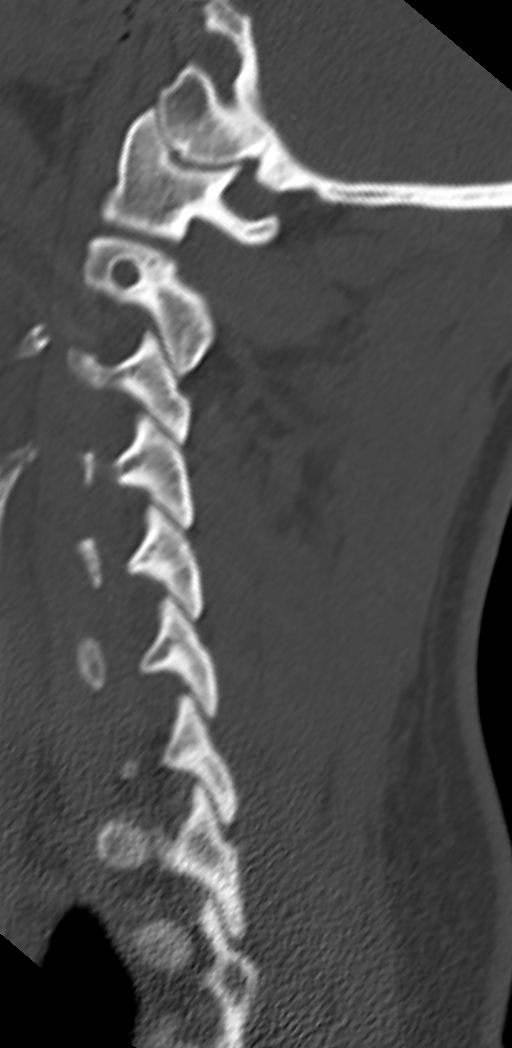
[im 20/47  bone]
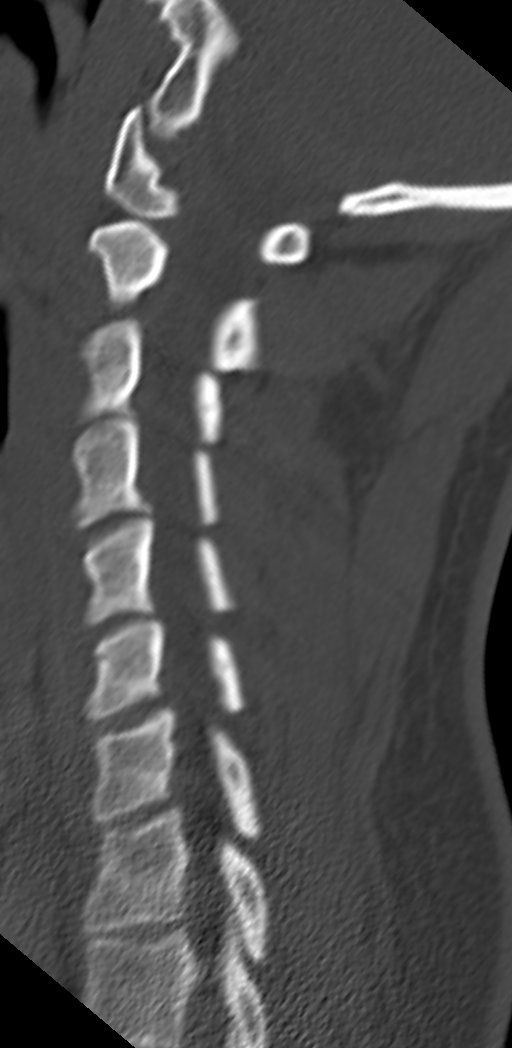
[im 24/47  soft-tissue]
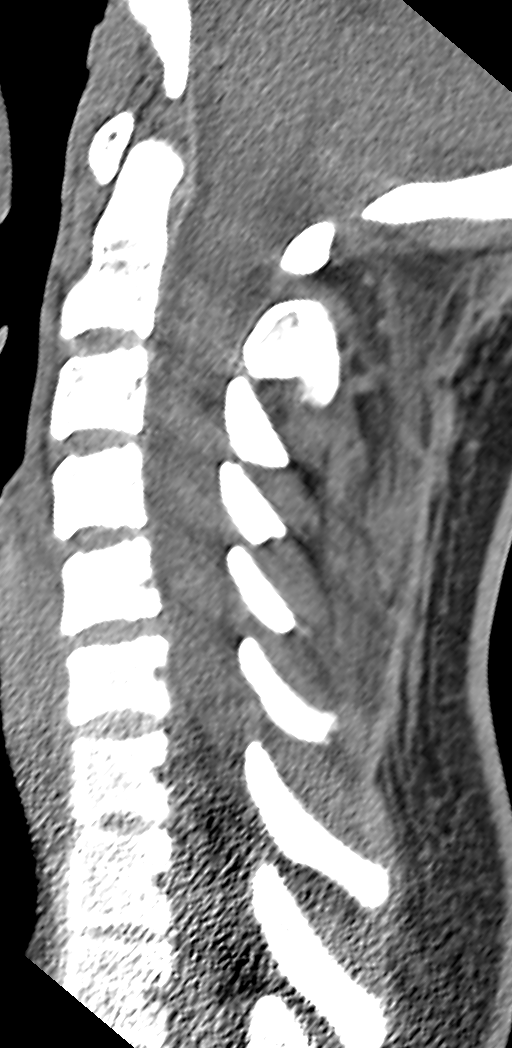
[im 24/47  bone]
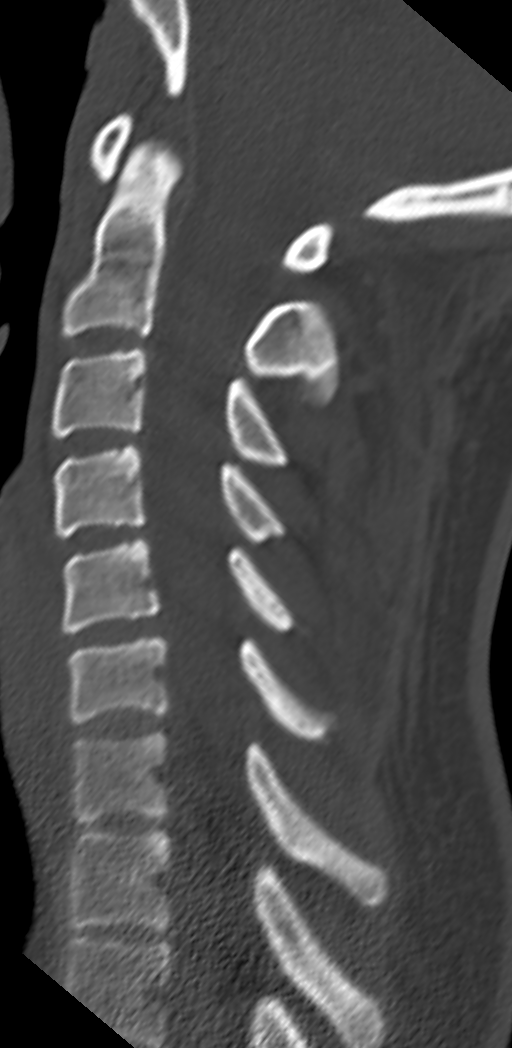
[im 27/47  bone]
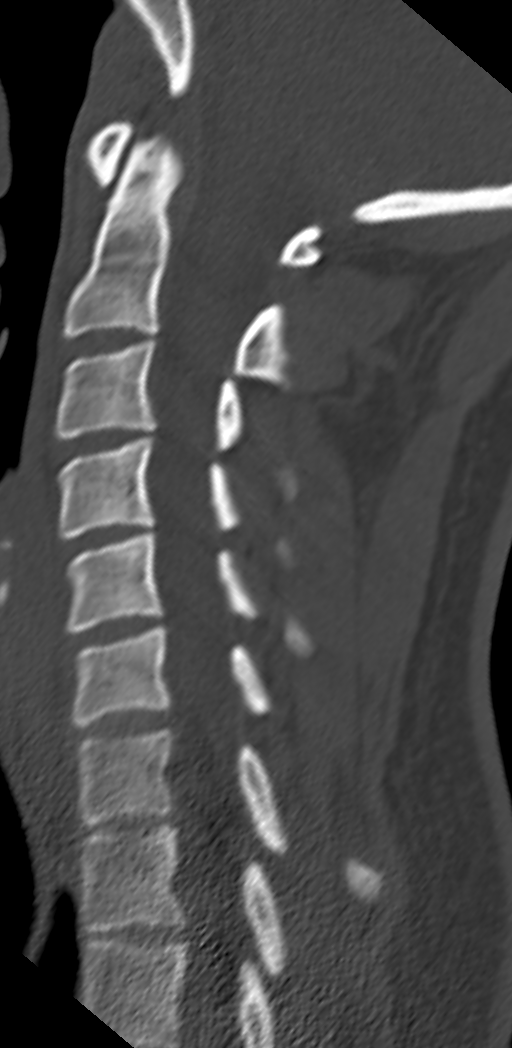
[im 31/47  bone]
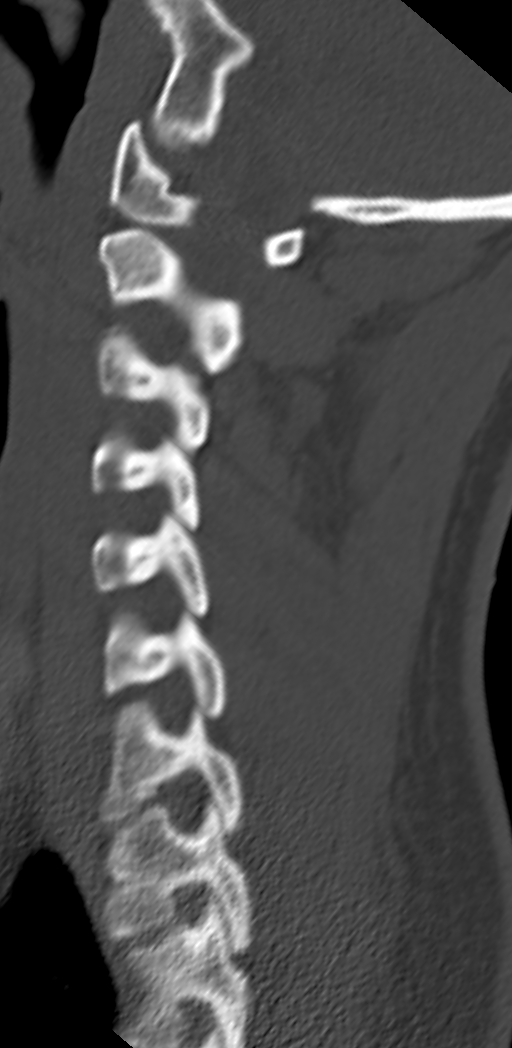

[11 of 27 positions shown; findings below may reference images not displayed]

FINDINGS: CT HEAD FINDINGS

There is no acute intracranial hemorrhage or infarct. No mass lesion
or midline shift. Gray-white matter differentiation is well
maintained. Ventricles are normal in size without evidence of
hydrocephalus. CSF containing spaces are within normal limits. No
extra-axial fluid collection.

The calvarium is intact.

Orbital soft tissues are within normal limits.

The paranasal sinuses and mastoid air cells are well pneumatized and
free of fluid. Mild mucoperiosteal thickening noted within the right
sphenoid sinus.

Scalp soft tissues are unremarkable.

CT CERVICAL SPINE FINDINGS

The vertebral bodies are normally aligned with preservation of the
normal cervical lordosis. Vertebral body heights are preserved.
Normal C1-2 articulations are intact. No prevertebral soft tissue
swelling. No acute fracture or listhesis.

Visualized soft tissues of the neck are within normal limits.
Visualized lung apices are clear without evidence of apical
pneumothorax.
IMPRESSION: CT BRAIN:

No acute intracranial process.

CT CERVICAL SPINE:

No acute traumatic injury within the cervical spine.

## 2015-06-15 ENCOUNTER — Ambulatory Visit (INDEPENDENT_AMBULATORY_CARE_PROVIDER_SITE_OTHER): Payer: Self-pay | Admitting: Family Medicine

## 2015-06-15 VITALS — BP 107/68 | HR 69 | Temp 97.9°F | Resp 16 | Ht 64.5 in | Wt 170.0 lb

## 2015-06-15 DIAGNOSIS — F331 Major depressive disorder, recurrent, moderate: Secondary | ICD-10-CM

## 2015-06-15 DIAGNOSIS — Z8659 Personal history of other mental and behavioral disorders: Secondary | ICD-10-CM

## 2015-06-15 MED ORDER — BUPROPION HCL ER (SR) 150 MG PO TB12: 150.0000 mg | ORAL_TABLET | Freq: Two times a day (BID) | ORAL | Status: DC

## 2015-06-15 NOTE — Progress Notes (Signed)
[facility] [address] [place] [address] [phone]  Date:  06/15/2015   Name:  Kirsten Fox   DOB:  23-Jan-1986   MRN:  [id number]  PCP:  No PCP Per Patient    Chief Complaint: Depression; Possible Pregnancy; and Medication Management   History of Present Illness:  Kirsten Fox is a 29 y.o. very pleasant female patient who presents with the following:  Here today for a recheck.  Last seen by myself 18 months ago for anxiety. At that time she was taking xanax as needed for panic sx She states that she had to have an abortion a couple of months ago, and this was the end of her relationship.  She did not really want to terminate the pregnancy but did not feel that she could go through with it as her relationship was falling apart.   She has since started exercising, she is trying to take better care of herself.  However she feels really tired, has poor motivation to do thinks besides work out.  She "cannot focus."  Spending too much time sleeping She is eating but following a very strict diet She has lost 25 lbs over the last couple of months through exercise and diet change She has also started getting back in church She feels like she is making some progress but slowly  She did not go back to [place] yet for her follow-up visit.   Had requested a pregnancy test but then decided to do at home as she is self pay.  She has not yet had a MP since her proceure She did have protected sex since her procedure.    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   No SI  Patient  Active Problem List   Diagnosis Date Noted  . Gastroparesis 01/14/2014  . Generalized anxiety disorder 09/27/2013  . GERD (gastroesophageal reflux disease) 08/22/2013  . 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 ALLERGIC RHINITIS 06/26/2008  . DENTAL CARIES 06/26/2008    Past Medical History  Diagnosis Date  . Panic attacks   . Abnormal Pap smear     2 colposcopys  . Depression   . Ulcer   . ADHD (attention deficit hyperactivity disorder)   . GERD (gastroesophageal reflux disease)   . 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 Thyroid disease     hypothyroid  . Vaginal Pap smear, abnormal   . Headache   . Hypothyroidism     Past Surgical History  Procedure Laterality Date  . Fracture surgery      on the R arm with metal rod in forearm  . Cosmetic surgery    . Nasal septum surgery    . Breast surgery      Implants  . Rhinoplasty    . Arm surgery      History  Substance Use Topics  . Smoking status: Never Smoker   . Smokeless tobacco: Former Neurosurgeon  . Alcohol Use: Yes  Comment: once a week when not pregnant     Family History  Problem Relation Age of Onset  . Breast cancer Paternal Grandmother   . Liver disease Father   . Alcohol abuse Father   . Colon cancer Neg Hx   . Esophageal cancer Neg Hx   . Stomach cancer Neg Hx   . Rectal cancer Neg Hx     Allergies  Allergen Reactions  . Dilaudid [Hydromorphone Hcl] Other (See Comments)    Does not work for pt  . Hydrocodone-Acetaminophen Hives and Nausea Only    Does not work for pt  . Morphine And Related Other (See Comments)    Does not work.  . Topamax [Topiramate] Swelling and Other (See Comments)    Causes numbness in legs and arms    Medication list has been reviewed and updated.  No current outpatient prescriptions on file prior to visit.   No current facility-administered medications on file prior to visit.    Review of Systems:  As per HPI- otherwise  negative.   Physical Examination: Filed Vitals:   06/15/15 1440  BP: 107/68  Pulse: 69  Temp: 97.9 F (36.6 C)  Resp: 16   Filed Vitals:   06/15/15 1440  Height: 5' 4.5" (1.638 m)  Weight: 170 lb (77.111 kg)   Body mass index is 28.74 kg/(m^2). Ideal Body Weight: Weight in (lb) to have BMI = 25: 147.6  GEN: WDWN, NAD, Non-toxic, A & O x 3, mild overweight, looks well HEENT: Atraumatic, Normocephalic. Neck supple. No masses, No LAD. Ears and Nose: No external deformity. CV: RRR, No M/G/R. No JVD. No thrill. No extra heart sounds. PULM: CTA B, no wheezes, crackles, rhonchi. No retractions. No resp. distress. No accessory muscle use. EXTR: No c/c/e NEURO Normal gait.  PSYCH: Normally interactive. Conversant. Not depressed or anxious appearing.  Calm demeanor.    Assessment and Plan: Major depressive disorder, recurrent episode, moderate - Plan: buPROPion (WELLBUTRIN SR) 150 MG 12 hr tablet  History of recent stressful life event - Plan: buPROPion (WELLBUTRIN SR) 150 MG 12 hr tablet  She will take a home HCG to ensure abortion completed prior to starting medication Recent life stressors, return of depression sx Restart wellbutrin which has worked well for her in the past  Asked her to update me in a couple of weeks  Signed Abbe Amsterdam, MD

## 2015-06-15 NOTE — Patient Instructions (Signed)
Take a home pregnancy test to be sure that all is clear prior to starting the wellbutrin Start the wellbutrin once a day, and increase to twice a day after 3-4 days Take care of yourself- be sure you are not overdoing it!  Let me know how you are doing in the next couple of weeks

## 2015-07-23 ENCOUNTER — Encounter: Payer: Self-pay | Admitting: Family Medicine

## 2015-07-23 DIAGNOSIS — F3289 Other specified depressive episodes: Secondary | ICD-10-CM

## 2015-07-25 MED ORDER — BUPROPION HCL ER (SR) 200 MG PO TB12: 200.0000 mg | ORAL_TABLET | Freq: Two times a day (BID) | ORAL | Status: AC

## 2015-07-25 NOTE — Addendum Note (Signed)
Addended by: Abbe Amsterdam C on: 07/25/2015 12:42 PM   Modules accepted: Orders, Medications

## 2015-07-25 NOTE — Telephone Encounter (Signed)
Called her to discuss. She would like to increase her wellbutrin SR to 200 BID.  Make this change for her, she will keep me posted on her progress

## 2016-03-23 ENCOUNTER — Inpatient Hospital Stay (HOSPITAL_COMMUNITY)
Admission: AD | Admit: 2016-03-23 | Discharge: 2016-03-23 | Discharge status: Against Medical Advice | Payer: Self-pay | Source: Ambulatory Visit | Attending: Obstetrics & Gynecology | Admitting: Obstetrics & Gynecology

## 2016-03-23 ENCOUNTER — Encounter (HOSPITAL_COMMUNITY): Payer: Self-pay | Admitting: *Deleted

## 2016-03-23 ENCOUNTER — Inpatient Hospital Stay (HOSPITAL_COMMUNITY): Payer: Self-pay

## 2016-03-23 DIAGNOSIS — Z888 Allergy status to other drugs, medicaments and biological substances status: Secondary | ICD-10-CM | POA: Insufficient documentation

## 2016-03-23 DIAGNOSIS — K219 Gastro-esophageal reflux disease without esophagitis: Secondary | ICD-10-CM | POA: Insufficient documentation

## 2016-03-23 DIAGNOSIS — O26899 Other specified pregnancy related conditions, unspecified trimester: Secondary | ICD-10-CM

## 2016-03-23 DIAGNOSIS — R1032 Left lower quadrant pain: Secondary | ICD-10-CM | POA: Insufficient documentation

## 2016-03-23 DIAGNOSIS — E039 Hypothyroidism, unspecified: Secondary | ICD-10-CM | POA: Insufficient documentation

## 2016-03-23 DIAGNOSIS — O26891 Other specified pregnancy related conditions, first trimester: Secondary | ICD-10-CM | POA: Insufficient documentation

## 2016-03-23 DIAGNOSIS — F329 Major depressive disorder, single episode, unspecified: Secondary | ICD-10-CM | POA: Insufficient documentation

## 2016-03-23 DIAGNOSIS — R109 Unspecified abdominal pain: Secondary | ICD-10-CM

## 2016-03-23 DIAGNOSIS — Z885 Allergy status to narcotic agent status: Secondary | ICD-10-CM | POA: Insufficient documentation

## 2016-03-23 DIAGNOSIS — Z3A01 Less than 8 weeks gestation of pregnancy: Secondary | ICD-10-CM | POA: Insufficient documentation

## 2016-03-23 DIAGNOSIS — F909 Attention-deficit hyperactivity disorder, unspecified type: Secondary | ICD-10-CM | POA: Insufficient documentation

## 2016-03-23 LAB — URINALYSIS, ROUTINE W REFLEX MICROSCOPIC
Bilirubin Urine: NEGATIVE
Glucose, UA: NEGATIVE mg/dL
Hgb urine dipstick: NEGATIVE
Ketones, ur: NEGATIVE mg/dL
LEUKOCYTES UA: NEGATIVE
Nitrite: NEGATIVE
Protein, ur: NEGATIVE mg/dL
SPECIFIC GRAVITY, URINE: 1.01 (ref 1.005–1.030)
pH: 6.5 (ref 5.0–8.0)

## 2016-03-23 LAB — WET PREP, GENITAL
CLUE CELLS WET PREP: NONE SEEN
Sperm: NONE SEEN
Trich, Wet Prep: NONE SEEN
YEAST WET PREP: NONE SEEN

## 2016-03-23 LAB — POCT PREGNANCY, URINE: PREG TEST UR: POSITIVE — AB

## 2016-03-23 LAB — HCG, QUANTITATIVE, PREGNANCY: hCG, Beta Chain, Quant, S: 2273 m[IU]/mL — ABNORMAL HIGH (ref <5)

## 2016-03-23 NOTE — MAU Note (Signed)
Pt had +upt at home today. Pt c/o LLQ abdominal pain that started about 1 week and swollen hands and feet-thought was due to traveling the past week. Denies vaginal bleeding or discharge. LMP: 02/23/2016.

## 2016-03-23 NOTE — MAU Provider Note (Signed)
History     CSN: [id number]  Arrival date and time: 03/23/16 1925   First Provider Initiated Contact with Patient 03/23/16 2000      Chief Complaint  Patient presents with  . Possible Pregnancy  . Abdominal Pain   HPI Comments: Kirsten Fox is a 30 y.o. G2P0 at 2274w1d presenting with +HPT and LLQ abdominal pain. She initially had left-sided sharp pain last week that resolved but has recurred as constant dull aching pain left suprapubic region. Unplanned pregnancy, considering termination.    Abdominal Pain This is a new problem. The current episode started today. The onset quality is gradual. The problem occurs constantly. The problem has been gradually worsening. The pain is located in the LLQ and suprapubic region (left suprapubic). The pain is at a severity of 6/10. The pain is moderate. The quality of the pain is aching. The abdominal pain radiates to the LLQ. Pertinent negatives include no anorexia, constipation, diarrhea, dysuria, frequency or hematuria. Nothing aggravates the pain. The pain is relieved by nothing. She has tried nothing for the symptoms.    OB History  Gravida Para Term Preterm AB SAB TAB Ectopic Multiple Living  2         0    # Outcome Date GA Lbr Len/2nd Weight Sex Delivery Anes PTL Lv  2 Current           1 Gravida               Past Medical History  Diagnosis Date  . Panic attacks   . Abnormal Pap smear     2 colposcopys  . Depression   . Ulcer   . ADHD (attention deficit hyperactivity disorder)   . GERD (gastroesophageal reflux disease)   . 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 Thyroid disease     hypothyroid  . Vaginal Pap smear, abnormal   . Headache   . Hypothyroidism     Past Surgical History  Procedure Laterality Date  . Fracture surgery      on the R arm with metal rod in forearm  . Cosmetic surgery    . Nasal septum surgery    . Breast surgery      Implants  . Rhinoplasty    . Arm surgery      Family History   Problem Relation Age of Onset  . Breast cancer Paternal Grandmother   . Liver disease Father   . Alcohol abuse Father   . Colon cancer Neg Hx   . Esophageal cancer Neg Hx   . Stomach cancer Neg Hx   . Rectal cancer Neg Hx     Social History  Substance Use Topics  . Smoking status: Never Smoker   . Smokeless tobacco: Former NeurosurgeonUser  . Alcohol Use: No     Comment: once a week when not pregnant     Allergies:  Allergies  Allergen Reactions  . Dilaudid [Hydromorphone Hcl] Other (See Comments)    Does not work for pt  . Hydrocodone-Acetaminophen Hives and Nausea Only    Does not work for pt  . Morphine And Related Other (See Comments)    Does not work.  . Topamax [Topiramate] Swelling and Other (See Comments)    Causes numbness in legs and arms    Prescriptions prior to admission  Medication Sig Dispense Refill Last Dose  . buPROPion (WELLBUTRIN SR) 200 MG 12 hr tablet Take 1 tablet (200 mg total) by mouth  2 (two) times daily. 60 tablet 5 03/22/2016 at Unknown time    Review of Systems  Gastrointestinal: Positive for abdominal pain. Negative for diarrhea, constipation and anorexia.  Genitourinary: Negative for dysuria, urgency, frequency and hematuria.       No vaginal bleeding  Musculoskeletal: Negative for back pain.  Neurological: Negative for dizziness.  Psychiatric/Behavioral: Negative for depression.   Physical Exam   Blood pressure 127/60, pulse 84, temperature 98.6 F (37 C), temperature source Oral, resp. rate 16, height  (1.626 m), weight 76.658 kg (169 lb), last menstrual period 02/23/2016, SpO2 100 %, unknown if currently breastfeeding.  Physical Exam  Constitutional: She appears well-developed and well-nourished. No distress.  HENT:  Head: Normocephalic.  Cardiovascular: Normal rate.   Respiratory: Effort normal.  GI: There is tenderness.  Localized mild-moderate TTP left suprapubic region  Musculoskeletal: Normal range of motion.  Skin: Skin is  warm and dry.    MAU Course  Procedures Results for orders placed or performed during the hospital encounter of 03/23/16 (from the past 24 hour(s))  Urinalysis, Routine w reflex microscopic (not at [facility])     Status: None   Collection Time: 03/23/16  7:38 PM  Result Value Ref Range   Color, Urine YELLOW YELLOW   APPearance CLEAR CLEAR   Specific Gravity, Urine 1.010 1.005 - 1.030   pH 6.5 5.0 - 8.0   Glucose, UA NEGATIVE NEGATIVE mg/dL   Hgb urine dipstick NEGATIVE NEGATIVE   Bilirubin Urine NEGATIVE NEGATIVE   Ketones, ur NEGATIVE NEGATIVE mg/dL   Protein, ur NEGATIVE NEGATIVE mg/dL   Nitrite NEGATIVE NEGATIVE   Leukocytes, UA NEGATIVE NEGATIVE  Pregnancy, urine POC     Status: Abnormal   Collection Time: 03/23/16  7:45 PM  Result Value Ref Range   Preg Test, Ur POSITIVE (A) NEGATIVE  Wet prep, genital     Status: Abnormal   Collection Time: 03/23/16  8:34 PM  Result Value Ref Range   Yeast Wet Prep HPF POC NONE SEEN NONE SEEN   Trich, Wet Prep NONE SEEN NONE SEEN   Clue Cells Wet Prep HPF POC NONE SEEN NONE SEEN   WBC, Wet Prep HPF POC FEW (A) NONE SEEN   Sperm NONE SEEN    HIV, GC/CT sent  Care assumed by Thressa Sheller CNM at 2100  Assessment and Plan    Braidon Chermak 03/23/2016, 8:49 PM

## 2016-03-23 NOTE — MAU Provider Note (Signed)
Care assumed from D. Poe, CNM  Patient states that she cannot wait any longer. She signed out AMA and states that she has to catch a flight tomorrow.   [place] Ob Comp Less 14 Wks  03/23/2016  CLINICAL DATA:  30 year old female with left lower quadrant abdominal pain. EXAM: OBSTETRIC <14 WK US AND TRANSVAGINAL OB US TECHNIQUE: Both transabdominal and transvaginal ultrasound examinations were performed for complete evaluation of the gestation as well as the maternal uterus, adnexal regions, and pelvic cul-de-sac. Transvaginal technique was performed to assess early pregnancy. COMPARISON:  None. FINDINGS: The uterus is anteverted. A cystic structure within the endometrium noted with surrounding decidual reaction. A faint curvilinear density within this cystic structure may represent an early yolk sac. No fetal pole identified. This structure most likely represents an early gestational sac. However, in the absence of definite yolk sac or fetal pole the possibility of a blighted ovum or pseudo gestation of an ectopic pregnancy is not excluded. Correlation with clinical exam and follow-up with serial HCG levels and ultrasound recommended. If this cystic structure is a true gestational sac the estimated gestational age based on mean sac diameter of 5 mm is 5 weeks, 2 days. No subchorionic hemorrhage identified. The maternal ovaries appear unremarkable. The right ovary measures 2.9 x 2.0 x 2.8 cm and the left ovary measures 2.6 x 1.0 x 1.7 cm. No significant free fluid identified within the pelvis. IMPRESSION: Cystic structure within the endometrium as described likely representing an early gestational sac with an estimated gestational age of [redacted] weeks, 2 days. Follow-up with serial HCG levels and ultrasound recommended. Electronically Signed   By: Elgie CollardArash  Radparvar M.D.   On: 03/23/2016 23:18   [place] Ob Transvaginal  03/23/2016  CLINICAL DATA:  30 year old female with left lower quadrant abdominal pain. EXAM: OBSTETRIC <14  WK US AND TRANSVAGINAL OB US TECHNIQUE: Both transabdominal and transvaginal ultrasound examinations were performed for complete evaluation of the gestation as well as the maternal uterus, adnexal regions, and pelvic cul-de-sac. Transvaginal technique was performed to assess early pregnancy. COMPARISON:  None. FINDINGS: The uterus is anteverted. A cystic structure within the endometrium noted with surrounding decidual reaction. A faint curvilinear density within this cystic structure may represent an early yolk sac. No fetal pole identified. This structure most likely represents an early gestational sac. However, in the absence of definite yolk sac or fetal pole the possibility of a blighted ovum or pseudo gestation of an ectopic pregnancy is not excluded. Correlation with clinical exam and follow-up with serial HCG levels and ultrasound recommended. If this cystic structure is a true gestational sac the estimated gestational age based on mean sac diameter of 5 mm is 5 weeks, 2 days. No subchorionic hemorrhage identified. The maternal ovaries appear unremarkable. The right ovary measures 2.9 x 2.0 x 2.8 cm and the left ovary measures 2.6 x 1.0 x 1.7 cm. No significant free fluid identified within the pelvis. IMPRESSION: Cystic structure within the endometrium as described likely representing an early gestational sac with an estimated gestational age of [redacted] weeks, 2 days. Follow-up with serial HCG levels and ultrasound recommended. Electronically Signed   By: Elgie CollardArash  Radparvar M.D.   On: 03/23/2016 23:18   Results for orders placed or performed during the hospital encounter of 03/23/16 (from the past 24 hour(s))  Urinalysis, Routine w reflex microscopic [facility])     Status: None   Collection Time: 03/23/16  7:38 PM  Result Value Ref Range   Color, Urine  YELLOW YELLOW   APPearance CLEAR CLEAR   Specific Gravity, Urine 1.010 1.005 - 1.030   pH 6.5 5.0 - 8.0   Glucose, UA NEGATIVE NEGATIVE mg/dL   Hgb urine  dipstick NEGATIVE NEGATIVE   Bilirubin Urine NEGATIVE NEGATIVE   Ketones, ur NEGATIVE NEGATIVE mg/dL   Protein, ur NEGATIVE NEGATIVE mg/dL   Nitrite NEGATIVE NEGATIVE   Leukocytes, UA NEGATIVE NEGATIVE  Pregnancy, urine POC     Status: Abnormal   Collection Time: 03/23/16  7:45 PM  Result Value Ref Range   Preg Test, Ur POSITIVE (A) NEGATIVE  hCG, quantitative, pregnancy     Status: Abnormal   Collection Time: 03/23/16  8:14 PM  Result Value Ref Range   hCG, Beta Chain, Quant, S 2273 (H) <5 mIU/mL  Wet prep, genital     Status: Abnormal   Collection Time: 03/23/16  8:34 PM  Result Value Ref Range   Yeast Wet Prep HPF POC NONE SEEN NONE SEEN   Trich, Wet Prep NONE SEEN NONE SEEN   Clue Cells Wet Prep HPF POC NONE SEEN NONE SEEN   WBC, Wet Prep HPF POC FEW (A) NONE SEEN   Sperm NONE SEEN    ASSESSMENT/PLAN:  Pregnancy of unknown location Ectopic precautions  Patient left the unit prior to results coming back No plan established at this time.

## 2016-03-23 NOTE — MAU Note (Signed)
Pt. States she took a home test and it was positive. Has not been feeling well and having diarrhea and cramping last week. Pt. States she also had chills last week and was traveling at that time. Weight has increased by 15 pounds she states is water weight. Pt. States she was having a lot of cramping but now is feeling a dull ache in her lower left side.

## 2016-03-24 LAB — HIV ANTIBODY (ROUTINE TESTING W REFLEX): HIV Screen 4th Generation wRfx: NONREACTIVE

## 2016-03-24 LAB — GC/CHLAMYDIA PROBE AMP (~~LOC~~) NOT AT ARMC
CHLAMYDIA, DNA PROBE: NEGATIVE
NEISSERIA GONORRHEA: NEGATIVE

## 2016-05-24 ENCOUNTER — Emergency Department (HOSPITAL_BASED_OUTPATIENT_CLINIC_OR_DEPARTMENT_OTHER)
Admission: EM | Admit: 2016-05-24 | Discharge: 2016-05-24 | Disposition: A | Discharge status: Home / Self Care | Payer: Managed Care, Other (non HMO) | Attending: Emergency Medicine | Admitting: Emergency Medicine

## 2016-05-24 ENCOUNTER — Encounter (HOSPITAL_BASED_OUTPATIENT_CLINIC_OR_DEPARTMENT_OTHER): Payer: Self-pay | Admitting: Emergency Medicine

## 2016-05-24 DIAGNOSIS — R109 Unspecified abdominal pain: Secondary | ICD-10-CM | POA: Diagnosis present

## 2016-05-24 DIAGNOSIS — E039 Hypothyroidism, unspecified: Secondary | ICD-10-CM | POA: Diagnosis not present

## 2016-05-24 DIAGNOSIS — Z87891 Personal history of nicotine dependence: Secondary | ICD-10-CM | POA: Diagnosis not present

## 2016-05-24 DIAGNOSIS — F418 Other specified anxiety disorders: Secondary | ICD-10-CM | POA: Insufficient documentation

## 2016-05-24 DIAGNOSIS — K529 Noninfective gastroenteritis and colitis, unspecified: Secondary | ICD-10-CM | POA: Diagnosis not present

## 2016-05-24 DIAGNOSIS — F419 Anxiety disorder, unspecified: Secondary | ICD-10-CM

## 2016-05-24 LAB — CBC WITH DIFFERENTIAL/PLATELET
BASOS ABS: 0 10*3/uL (ref 0.0–0.1)
Basophils Relative: 0 %
EOS ABS: 0 10*3/uL (ref 0.0–0.7)
EOS PCT: 1 %
HCT: 39.4 % (ref 36.0–46.0)
Hemoglobin: 13.7 g/dL (ref 12.0–15.0)
Lymphocytes Relative: 31 %
Lymphs Abs: 2.2 10*3/uL (ref 0.7–4.0)
MCH: 32.6 pg (ref 26.0–34.0)
MCHC: 34.8 g/dL (ref 30.0–36.0)
MCV: 93.8 fL (ref 78.0–100.0)
Monocytes Absolute: 0.6 10*3/uL (ref 0.1–1.0)
Monocytes Relative: 9 %
Neutro Abs: 4.1 10*3/uL (ref 1.7–7.7)
Neutrophils Relative %: 59 %
PLATELETS: 251 10*3/uL (ref 150–400)
RBC: 4.2 MIL/uL (ref 3.87–5.11)
RDW: 12.3 % (ref 11.5–15.5)
WBC: 6.9 10*3/uL (ref 4.0–10.5)

## 2016-05-24 LAB — COMPREHENSIVE METABOLIC PANEL
ALK PHOS: 38 U/L (ref 38–126)
ALT: 16 U/L (ref 14–54)
ANION GAP: 10 (ref 5–15)
AST: 19 U/L (ref 15–41)
Albumin: 4.5 g/dL (ref 3.5–5.0)
BUN: 6 mg/dL (ref 6–20)
CALCIUM: 9.1 mg/dL (ref 8.9–10.3)
CO2: 25 mmol/L (ref 22–32)
Chloride: 101 mmol/L (ref 101–111)
Creatinine, Ser: 0.59 mg/dL (ref 0.44–1.00)
GFR calc Af Amer: >60 mL/min (ref >60)
GFR calc non Af Amer: >60 mL/min (ref >60)
GLUCOSE: 85 mg/dL (ref 65–99)
POTASSIUM: 3.4 mmol/L — AB (ref 3.5–5.1)
SODIUM: 136 mmol/L (ref 135–145)
Total Bilirubin: 0.5 mg/dL (ref 0.3–1.2)
Total Protein: 7.4 g/dL (ref 6.5–8.1)

## 2016-05-24 LAB — LIPASE, BLOOD: Lipase: 18 U/L (ref 11–51)

## 2016-05-24 MED ORDER — ONDANSETRON HCL 4 MG/2ML IJ SOLN
4.0000 mg | Freq: Once | INTRAMUSCULAR | Status: AC
  Administered 2016-05-24: 4 mg via INTRAVENOUS
  Filled 2016-05-24: qty 2

## 2016-05-24 MED ORDER — LOPERAMIDE HCL 2 MG PO TABS: 2.0000 mg | ORAL_TABLET | Freq: Four times a day (QID) | ORAL | Status: AC | PRN

## 2016-05-24 MED ORDER — SODIUM CHLORIDE 0.9 % IV BOLUS (SEPSIS)
1000.0000 mL | Freq: Once | INTRAVENOUS | Status: AC
  Administered 2016-05-24: 1000 mL via INTRAVENOUS

## 2016-05-24 MED ORDER — PROMETHAZINE HCL 25 MG PO TABS: 25.0000 mg | ORAL_TABLET | Freq: Four times a day (QID) | ORAL | Status: AC | PRN

## 2016-05-24 MED ORDER — LORAZEPAM 2 MG/ML IJ SOLN
1.0000 mg | Freq: Once | INTRAMUSCULAR | Status: AC
  Administered 2016-05-24: 1 mg via INTRAVENOUS
  Filled 2016-05-24: qty 1

## 2016-05-24 MED ORDER — SODIUM CHLORIDE 0.9 % IV SOLN: INTRAVENOUS | Status: DC

## 2016-05-24 NOTE — ED Notes (Signed)
Patient called x 1  - patient at vending machine

## 2016-05-24 NOTE — ED Notes (Signed)
Patient states that she has not felt good over the last 2 days. N/V with stomach pain. Reports that she is not taking her anxiety medications so she is also having panic attacks. Patient reports that she is weak all over

## 2016-05-24 NOTE — ED Provider Notes (Signed)
CSN: [id number]     Arrival date & time 05/24/16  1622 History  By signing my name below, I, Terrance Branch, attest that this documentation has been prepared under the direction and in the presence of Vanetta MuldersScott Mishika Flippen, MD. Electronically Signed: Evon Slackerrance Branch, ED Scribe. 05/24/2016. 5:19 PM.      Chief Complaint  Patient presents with  . Abdominal Pain   Patient is a 30 y.o. female presenting with abdominal pain. The history is provided by the patient. No language interpreter was used.  Abdominal Pain Associated symptoms: chest pain   Associated symptoms: no chills, no cough, no diarrhea, no dysuria, no fever, no nausea, no shortness of breath, no sore throat and no vomiting    HPI Comments: Kirsten Fox is a 30 y.o. female who presents to the Emergency Department complaining of nausea vomiting and diarrhea that began 2 days ago. Pt reports that she has had associated chills and intermittent abdominal pain prior to vomiting. Pt doesn't report any medications PTA. Pt denies any recent travel. Pt denies fever. Denies HX of abdominal surgeries.    Past Medical History  Diagnosis Date  . Panic attacks   . Abnormal Pap smear     2 colposcopys  . Depression   . Ulcer   . ADHD (attention deficit hyperactivity disorder)   . GERD (gastroesophageal reflux disease)   . 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 Thyroid disease     hypothyroid  . Vaginal Pap smear, abnormal   . Headache   . Hypothyroidism    Past Surgical History  Procedure Laterality Date  . Fracture surgery      on the R arm with metal rod in forearm  . Cosmetic surgery    . Nasal septum surgery    . Breast surgery      Implants  . Rhinoplasty    . Arm surgery     Family History  Problem Relation Age of Onset  . Breast cancer Paternal Grandmother   . Liver disease Father   . Alcohol abuse Father   . Colon cancer Neg Hx   . Esophageal cancer Neg Hx   . Stomach cancer Neg Hx   . Rectal cancer Neg Hx     Social History  Substance Use Topics  . Smoking status: Never Smoker   . Smokeless tobacco: Former NeurosurgeonUser  . Alcohol Use: No     Comment: once a week when not pregnant    OB History    Gravida Para Term Preterm AB TAB SAB Ectopic Multiple Living   2         0      Review of Systems  Constitutional: Negative for fever and chills.  HENT: Negative for rhinorrhea and sore throat.   Eyes: Negative for visual disturbance.  Respiratory: Negative for cough and shortness of breath.   Cardiovascular: Positive for chest pain. Negative for leg swelling.  Gastrointestinal: Positive for abdominal pain. Negative for nausea, vomiting and diarrhea.  Genitourinary: Negative for dysuria.  Musculoskeletal: Positive for myalgias. Negative for back pain and neck pain.  Skin: Negative for rash.  Neurological: Positive for headaches.  Hematological: Does not bruise/bleed easily.  Psychiatric/Behavioral: Negative for confusion. The patient is nervous/anxious.      Allergies  Dilaudid; Hydrocodone-acetaminophen; Morphine and related; and Topamax  Home Medications   Prior to Admission medications   Medication Sig Start Date End Date Taking? Authorizing Provider  buPROPion (WELLBUTRIN SR) 200 MG 12  hr tablet Take 1 tablet (200 mg total) by mouth 2 (two) times daily. 07/25/15   Pearline Cables, MD  loperamide (IMODIUM A-D) 2 MG tablet Take 1 tablet (2 mg total) by mouth 4 (four) times daily as needed for diarrhea or loose stools. 05/24/16   Vanetta Mulders, MD  promethazine (PHENERGAN) 25 MG tablet Take 1 tablet (25 mg total) by mouth every 6 (six) hours as needed for nausea or vomiting. 05/24/16   Vanetta Mulders, MD   BP 132/83 mmHg  Pulse 93  Temp(Src) 99 F (37.2 C) (Oral)  Resp 16  Ht  (1.626 m)  Wt 72.576 kg  BMI 27.45 kg/m2  SpO2 100%  LMP 03/31/2016  Breastfeeding? Unknown   Physical Exam  Constitutional: She is oriented to person, place, and time. She appears well-developed and  well-nourished. No distress.  HENT:  Head: Normocephalic and atraumatic.  Dry mucous membranes.   Eyes: Conjunctivae and EOM are normal. Pupils are equal, round, and reactive to light. No scleral icterus.  Neck: Neck supple. No tracheal deviation present.  Cardiovascular: Normal rate, regular rhythm and normal heart sounds.   Pulmonary/Chest: Effort normal and breath sounds normal. No respiratory distress. She has no wheezes. She has no rales.  Abdominal: Bowel sounds are decreased. There is no tenderness.  Musculoskeletal: Normal range of motion. She exhibits no edema.  Neurological: She is alert and oriented to person, place, and time. No cranial nerve deficit.  Skin: Skin is warm and dry.  Psychiatric: She has a normal mood and affect. Her behavior is normal.  Nursing note and vitals reviewed.   ED Course  Procedures (including critical care time) DIAGNOSTIC STUDIES: Oxygen Saturation is 100% on RA, normal by my interpretation.    COORDINATION OF CARE: 5:19 PM-Discussed treatment plan which includes Iv fluids with pt at bedside and pt agreed to plan.     Labs Review Labs Reviewed  COMPREHENSIVE METABOLIC PANEL - Abnormal; Notable for the following:    Potassium 3.4 (*)    All other components within normal limits  CBC WITH DIFFERENTIAL/PLATELET  LIPASE, BLOOD    Imaging Review No results found.    EKG Interpretation None      MDM   Final diagnoses:  Gastroenteritis  Anxiety   The patient nontoxic no acute distress. Symptoms consistent with nausea vomiting and diarrhea suggestive of gastroenteritis. Abdomen is soft and nontender. Labs without significant abnormalities other than a very mild hypokalemia. No significant dehydration patient feels better after IV fluids and antinausea medicine. We'll continue her on Phenergan and Imodium right ear. Work note provided. In addition patient has a history of anxiety and panic attacks and had it panic attack here responded  well to Ativan.   I personally performed the services described in this documentation, which was scribed in my presence. The recorded information has been reviewed and is accurate.        Vanetta Mulders, MD 05/24/16 [phone]

## 2016-05-24 NOTE — Discharge Instructions (Signed)
Food Choices to Help Relieve Diarrhea, Adult °When you have diarrhea, the foods you eat and your eating habits are very important. Choosing the right foods and drinks can help relieve diarrhea. Also, because diarrhea can last up to 7 days, you need to replace lost fluids and electrolytes (such as sodium, potassium, and chloride) in order to help prevent dehydration.  °WHAT GENERAL GUIDELINES DO I NEED TO FOLLOW? °· Slowly drink 1 cup (8 oz) of fluid for each episode of diarrhea. If you are getting enough fluid, your urine will be clear or pale yellow. °· Eat starchy foods. Some good choices include white rice, white toast, pasta, low-fiber cereal, baked potatoes (without the skin), saltine crackers, and bagels. °· Avoid large servings of any cooked vegetables. °· Limit fruit to two servings per day. A serving is ½ cup or 1 small piece. °· Choose foods with less than 2 g of fiber per serving. °· Limit fats to less than 8 tsp (38 g) per day. °· Avoid fried foods. °· Eat foods that have probiotics in them. Probiotics can be found in certain dairy products. °· Avoid foods and beverages that may increase the speed at which food moves through the stomach and intestines (gastrointestinal tract). Things to avoid include: °¨ High-fiber foods, such as dried fruit, raw fruits and vegetables, nuts, seeds, and whole grain foods. °¨ Spicy foods and high-fat foods. °¨ Foods and beverages sweetened with high-fructose corn syrup, honey, or sugar alcohols such as xylitol, sorbitol, and mannitol. °WHAT FOODS ARE RECOMMENDED? °Grains °White rice. White, French, or pita breads (fresh or toasted), including plain rolls, buns, or bagels. White pasta. Saltine, soda, or graham crackers. Pretzels. Low-fiber cereal. Cooked cereals made with water (such as cornmeal, farina, or cream cereals). Plain muffins. Matzo. Melba toast. Zwieback.  °Vegetables °Potatoes (without the skin). Strained tomato and vegetable juices. Most well-cooked and canned  vegetables without seeds. Tender lettuce. °Fruits °Cooked or canned applesauce, apricots, cherries, fruit cocktail, grapefruit, peaches, pears, or plums. Fresh bananas, apples without skin, cherries, grapes, cantaloupe, grapefruit, peaches, oranges, or plums.  °Meat and Other Protein Products °Baked or boiled chicken. Eggs. Tofu. Fish. Seafood. Smooth peanut butter. Ground or well-cooked tender beef, ham, veal, lamb, pork, or poultry.  °Dairy °Plain yogurt, kefir, and unsweetened liquid yogurt. Lactose-free milk, buttermilk, or soy milk. Plain hard cheese. °Beverages °Sport drinks. Clear broths. Diluted fruit juices (except prune). Regular, caffeine-free sodas such as ginger ale. Water. Decaffeinated teas. Oral rehydration solutions. Sugar-free beverages not sweetened with sugar alcohols. °Other °Bouillon, broth, or soups made from recommended foods.  °The items listed above may not be a complete list of recommended foods or beverages. Contact your dietitian for more options. °WHAT FOODS ARE NOT RECOMMENDED? °Grains °Whole grain, whole wheat, bran, or rye breads, rolls, pastas, crackers, and cereals. Wild or brown rice. Cereals that contain more than 2 g of fiber per serving. Corn tortillas or taco shells. Cooked or dry oatmeal. Granola. Popcorn. °Vegetables °Raw vegetables. Cabbage, broccoli, Brussels sprouts, artichokes, baked beans, beet greens, corn, kale, legumes, peas, sweet potatoes, and yams. Potato skins. Cooked spinach and cabbage. °Fruits °Dried fruit, including raisins and dates. Raw fruits. Stewed or dried prunes. Fresh apples with skin, apricots, mangoes, pears, raspberries, and strawberries.  °Meat and Other Protein Products °Chunky peanut butter. Nuts and seeds. Beans and lentils. Bacon.  °Dairy °High-fat cheeses. Milk, chocolate milk, and beverages made with milk, such as milk shakes. Cream. Ice cream. °Sweets and Desserts °Sweet rolls, doughnuts, and sweet breads.   Pancakes and waffles. Fats and  Oils Butter. Cream sauces. Margarine. Salad oils. Plain salad dressings. Olives. Avocados.  Beverages Caffeinated beverages (such as coffee, tea, soda, or energy drinks). Alcoholic beverages. Fruit juices with pulp. Prune juice. Soft drinks sweetened with high-fructose corn syrup or sugar alcohols. Other Coconut. Hot sauce. Chili powder. Mayonnaise. Gravy. Cream-based or milk-based soups.  The items listed above may not be a complete list of foods and beverages to avoid. Contact your dietitian for more information. WHAT SHOULD I DO IF I BECOME DEHYDRATED? Diarrhea can sometimes lead to dehydration. Signs of dehydration include dark urine and dry mouth and skin. If you think you are dehydrated, you should rehydrate with an oral rehydration solution. These solutions can be purchased at pharmacies, retail stores, or online.  Drink -1 cup (120-240 mL) of oral rehydration solution each time you have an episode of diarrhea. If drinking this amount makes your diarrhea worse, try drinking smaller amounts more often. For example, drink 1-3 tsp (5-15 mL) every 5-10 minutes.  A general rule for staying hydrated is to drink 1-2 L of fluid per day. Talk to your health care provider about the specific amount you should be drinking each day. Drink enough fluids to keep your urine clear or pale yellow.   This information is not intended to replace advice given to you by your health care provider. Make sure you discuss any questions you have with your health care provider.   Take the Phenergan as needed for nausea and vomiting. Take Imodium right ear as needed for diarrhea. Work note provided. Return for any new or worse symptoms.   Document Released: 01/17/2004 Document Revised: 11/17/2014 Document Reviewed: 09/19/2013 [employer] Interactive Patient Education [employer]2016 [employer]

## 2016-05-26 ENCOUNTER — Ambulatory Visit (INDEPENDENT_AMBULATORY_CARE_PROVIDER_SITE_OTHER): Payer: Managed Care, Other (non HMO) | Admitting: Family Medicine

## 2016-05-26 VITALS — BP 124/76 | HR 89 | Temp 98.5°F | Resp 18 | Ht 64.0 in | Wt 162.0 lb

## 2016-05-26 DIAGNOSIS — F4321 Adjustment disorder with depressed mood: Secondary | ICD-10-CM

## 2016-05-26 DIAGNOSIS — F410 Panic disorder [episodic paroxysmal anxiety] without agoraphobia: Secondary | ICD-10-CM | POA: Diagnosis not present

## 2016-05-26 MED ORDER — CLONAZEPAM 0.5 MG PO TABS: ORAL_TABLET | ORAL | Status: AC

## 2016-05-26 MED ORDER — VENLAFAXINE HCL ER 37.5 MG PO CP24: 37.5000 mg | ORAL_CAPSULE | Freq: Every day | ORAL | Status: AC
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
## 2016-05-26 NOTE — Patient Instructions (Addendum)
Referral will be made to Dr. Emerson MonteParrish McKinney for your panic disorder  Take the Effexor (venlafaxine) 37.5 mg 1 each morning.  Decrease the Wellbutrin to one half pill daily (100 mg) for 1 week and then discontinue it.  Take the clonazepam 0.5 mg only if needed for bad panic disorder  Return as needed    IF you received an x-ray today, you will receive an invoice from [facility]. Please contact [facility] at [phone] with questions or concerns regarding your invoice.   IF you received labwork today, you will receive an invoice from [facility]. Please contact [facility] at [phone] with questions or concerns regarding your invoice.   Our billing staff will not be able to assist you with questions regarding bills from these companies.  You will be contacted with the lab results as soon as they are available. The fastest way to get your results is to activate your My Chart account. Instructions are located on the last page of this paperwork. If you have not heard from us regarding the results in 2 weeks, please contact this office.

## 2016-05-29 ENCOUNTER — Telehealth: Payer: Self-pay

## 2016-05-29 NOTE — Telephone Encounter (Signed)
Patient was prescribed Effexor and states it's causing blurred vision, lack of sleep, no sexual feeling. Please advise patient!  [phone]

## 2016-05-29 NOTE — Telephone Encounter (Signed)
it looks like she has been on only for 3 days so she can stop it - everything should return to normal in a few days - if not she needs to RTC - there is a referral in for Dr Nolen MumcKinney does the patient want to wait for that appt or does she want to start another medication
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
## 2016-05-30 NOTE — Telephone Encounter (Signed)
Spoke with pt, she states she is not going to see Dr Nolen MuMckinney and is researching another doctor to go see. She wants to see if she can try Cymbalta instead. She stated this was the other option that was dicussed at the OV. Please advise.

## 2016-06-02 NOTE — Telephone Encounter (Signed)
I am happy to write her for cymbalta - is she breastfeeding?  When I went to Rx for her an alert popped up so I want to make sure before I sent to the pharmacy.  I will start her at a low dose and then titrating up - she might want to [facility] to [facility] pyschiatry

## 2016-06-02 NOTE — Telephone Encounter (Signed)
Left message for pt to call back  °

## 2016-06-27 ENCOUNTER — Telehealth: Payer: Self-pay

## 2016-06-27 ENCOUNTER — Other Ambulatory Visit: Payer: Self-pay | Admitting: Physician Assistant

## 2016-06-27 MED ORDER — DULOXETINE HCL 20 MG PO CPEP: 20.0000 mg | ORAL_CAPSULE | Freq: Every day | ORAL | 1 refills | Status: AC

## 2016-06-27 NOTE — Telephone Encounter (Signed)
Notified pt Rx sent and advised to f/up in about 3 wks per Casimiro NeedleMichael. Pt loses ins at end of month so will RTC bf then.

## 2016-06-27 NOTE — Telephone Encounter (Signed)
Called pt back just to give her update that I am having another provider review and send in Rx, but that I can not send it in myself now so [facility]. I will call pt once it is done. Pt agreed.

## 2016-06-27 NOTE — Telephone Encounter (Signed)
I have started her on Cymbalta 20 daily.  Script sent electronically to [facility] on [address]. Deliah BostonMichael Mccabe Gloria, MS, PA-C 1:17 PM, 06/27/2016

## 2016-06-27 NOTE — Telephone Encounter (Signed)
Pt called back and told operator that she is not breastfeeding, or even pregnant. [facility] now and would like something called in now so she doesn't have to come back to [place] (lives out of town). Can anyone else send Rx in for Kirsten Fox to get pt started on Cymbalta?

## 2016-07-11 DEATH — deceased

## 2017-02-23 IMAGING — US US OB COMP LESS 14 WK
1 series · 15 of 28 positions shown · non-contrast
Comparison: None.

CLINICAL DATA: 29-year-old female with left lower quadrant
abdominal pain.

EXAM:
OBSTETRIC <14 WK US AND TRANSVAGINAL OB US
TECHNIQUE: Both transabdominal and transvaginal ultrasound examinations were
performed for complete evaluation of the gestation as well as the
maternal uterus, adnexal regions, and pelvic cul-de-sac.
Transvaginal technique was performed to assess early pregnancy.

[Series 1: us ob comp less 14 wk · 15 of 57 slices shown]
[im 1/57]
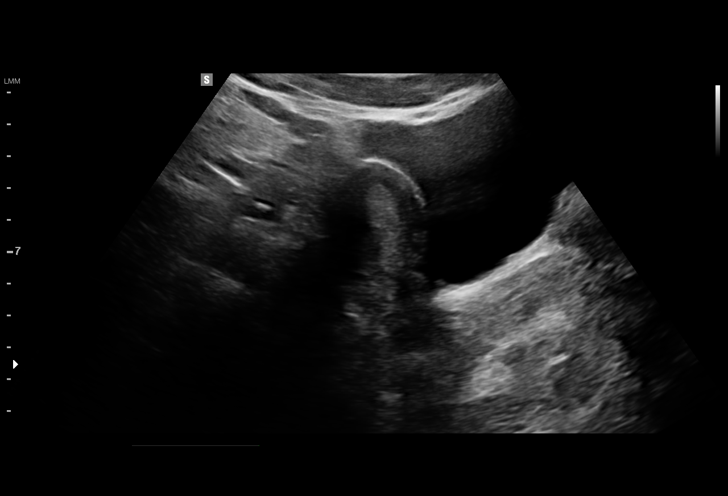
[im 5/57]
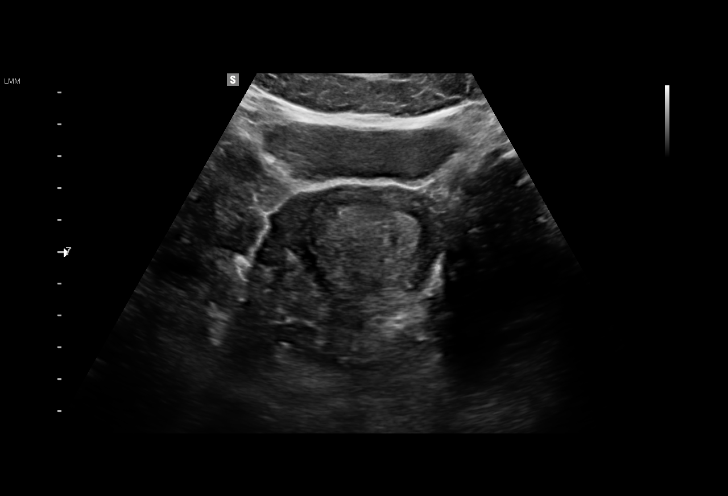
[im 9/57]
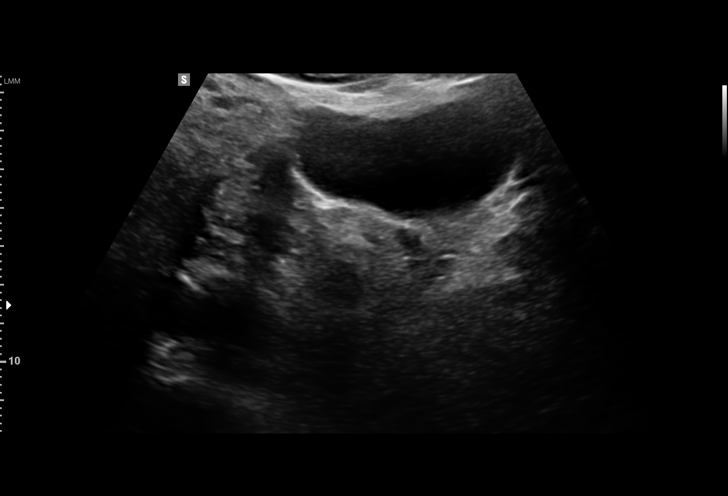
[im 13/57]
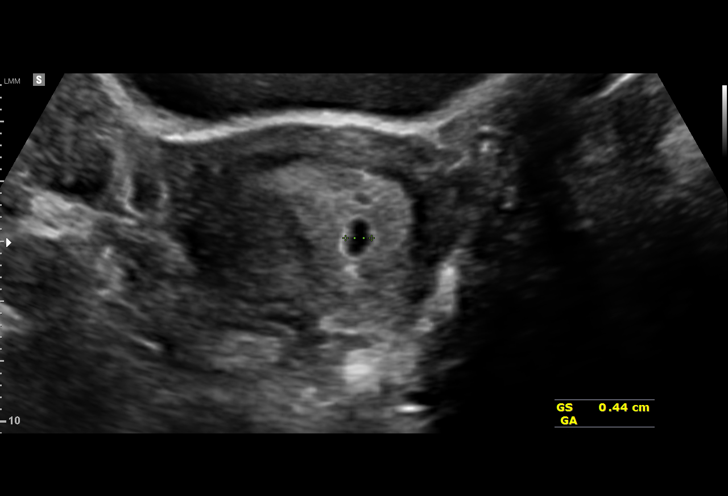
[im 17/57]
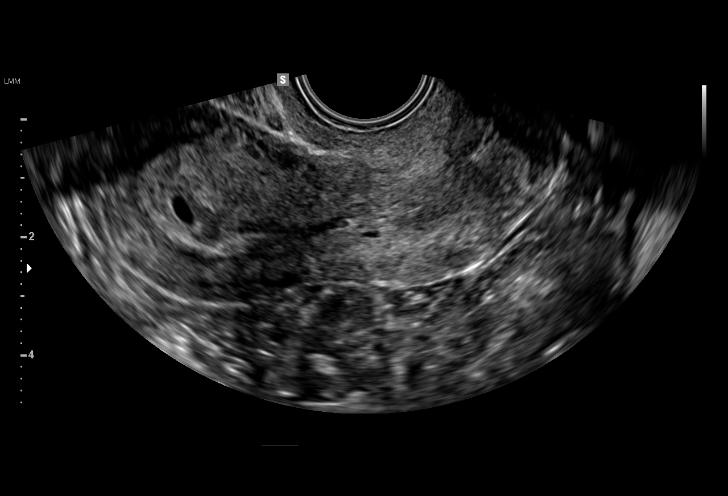
[im 21/57]
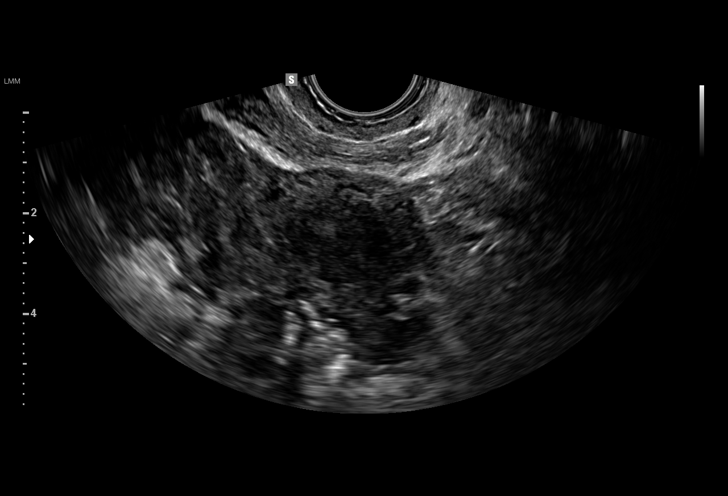
[im 25/57]
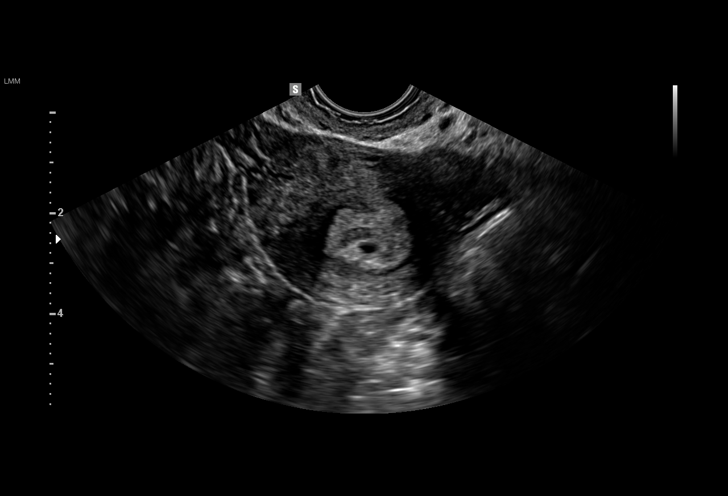
[im 30/57]
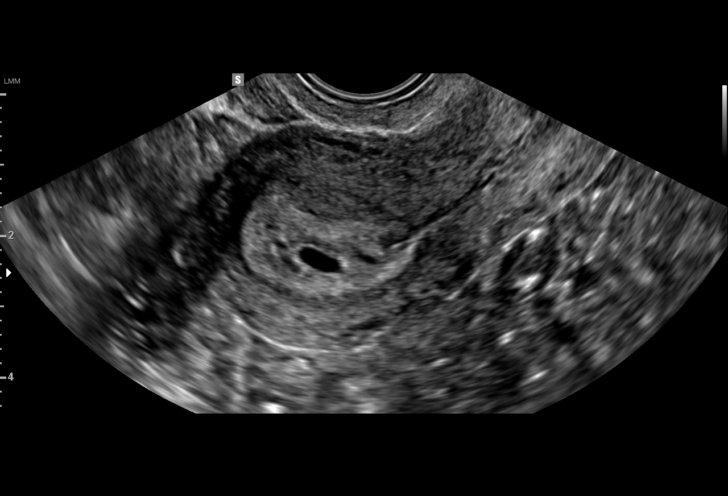
[im 32/57]
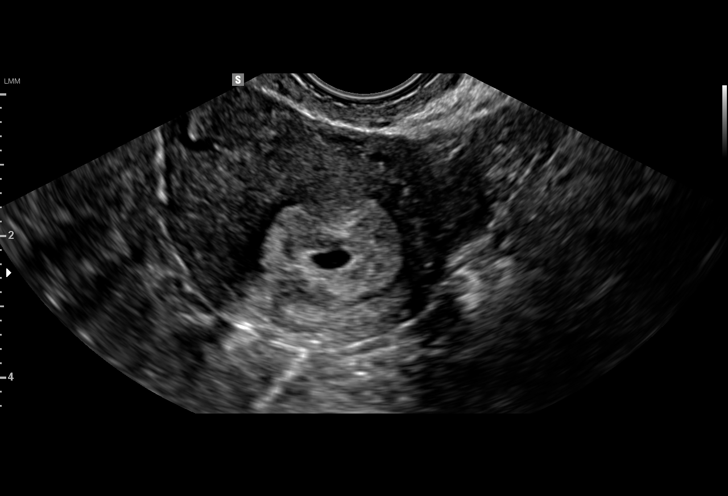
[im 36/57]
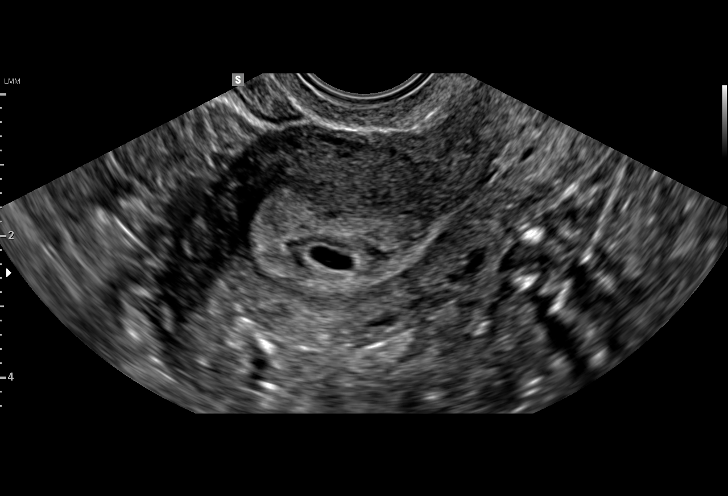
[im 40/57]
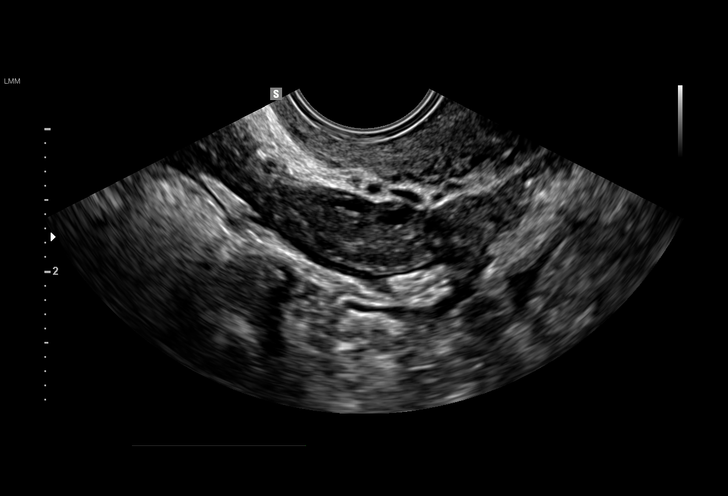
[im 44/57]
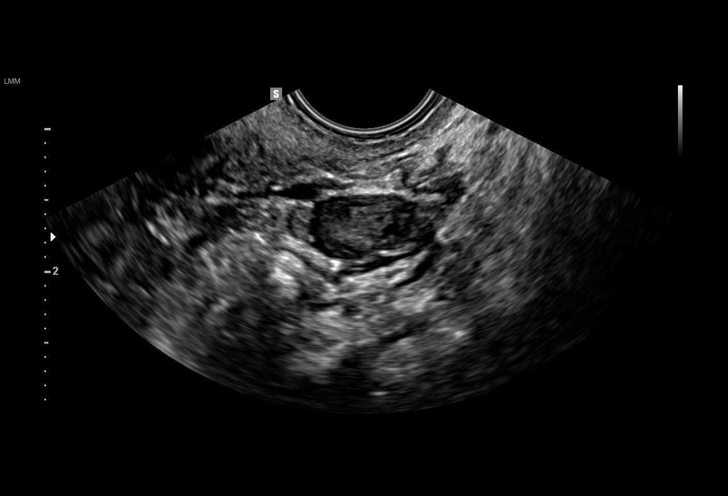
[im 48/57]
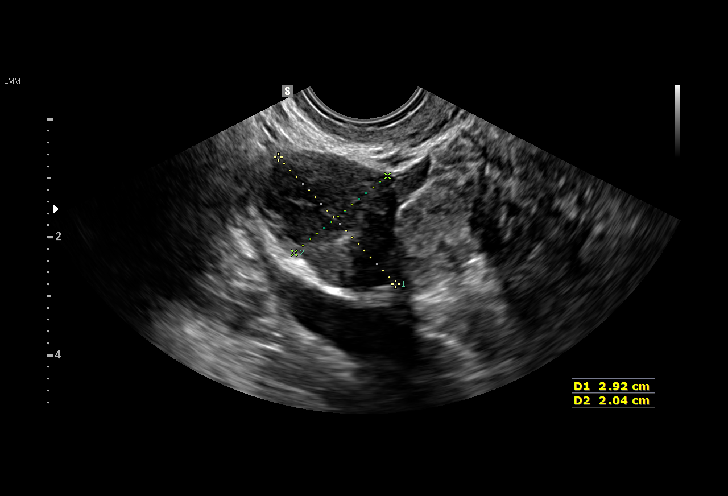
[im 52/57]
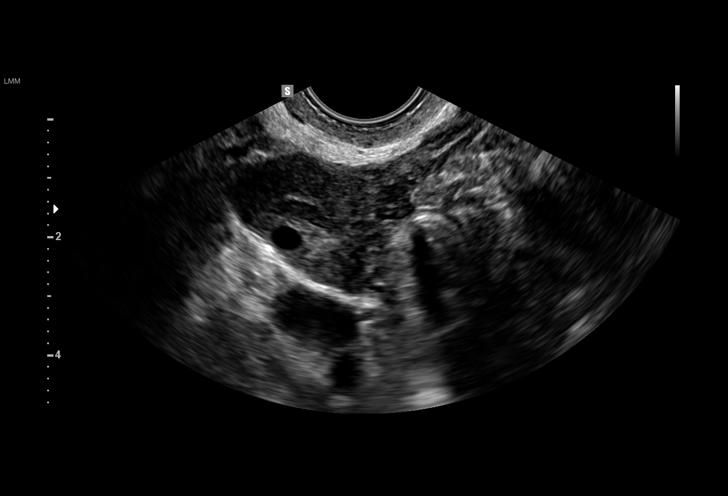
[im 57/57]
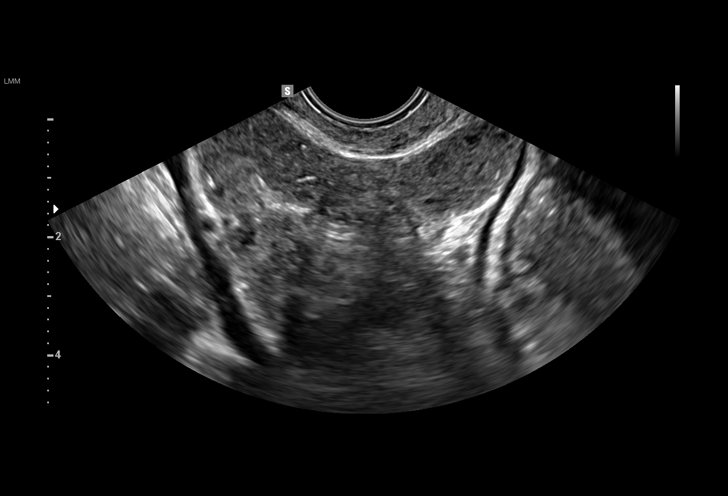

[15 of 28 positions shown; findings below may reference images not displayed]

FINDINGS: The uterus is anteverted. A cystic structure within the endometrium
noted with surrounding decidual reaction. A faint curvilinear
density within this cystic structure may represent an early yolk
sac. No fetal pole identified. This structure most likely represents
an early gestational sac. However, in the absence of definite yolk
sac or fetal pole the possibility of a blighted ovum or pseudo
gestation of an ectopic pregnancy is not excluded. Correlation with
clinical exam and follow-up with serial HCG levels and ultrasound
recommended.

If this cystic structure is a true gestational sac the estimated
gestational age based on mean sac diameter of 5 mm is 5 weeks, 2
days. No subchorionic hemorrhage identified.

The maternal ovaries appear unremarkable. The right ovary measures
2.9 x 2.0 x 2.8 cm and the left ovary measures 2.6 x 1.0 x 1.7 cm.

No significant free fluid identified within the pelvis.
IMPRESSION: Cystic structure within the endometrium as described likely
representing an early gestational sac with an estimated gestational
age of 5 weeks, 2 days. Follow-up with serial HCG levels and
ultrasound recommended.
